# Patient Record
Sex: Female | Born: 1965 | Race: White | Hispanic: No | Marital: Married | Smoking: Never smoker
Health system: Southern US, Community
[De-identification: ages and names within clinical notes are randomized; demographics above are authoritative.]

## PROBLEM LIST (undated history)

## (undated) DIAGNOSIS — N289 Disorder of kidney and ureter, unspecified: Secondary | ICD-10-CM

## (undated) DIAGNOSIS — T7840XA Allergy, unspecified, initial encounter: Secondary | ICD-10-CM

## (undated) DIAGNOSIS — K219 Gastro-esophageal reflux disease without esophagitis: Secondary | ICD-10-CM

## (undated) DIAGNOSIS — Z8616 Personal history of COVID-19: Secondary | ICD-10-CM

## (undated) DIAGNOSIS — E079 Disorder of thyroid, unspecified: Secondary | ICD-10-CM

## (undated) DIAGNOSIS — D649 Anemia, unspecified: Secondary | ICD-10-CM

## (undated) DIAGNOSIS — K6289 Other specified diseases of anus and rectum: Secondary | ICD-10-CM

## (undated) DIAGNOSIS — I10 Essential (primary) hypertension: Secondary | ICD-10-CM

## (undated) DIAGNOSIS — M1990 Unspecified osteoarthritis, unspecified site: Secondary | ICD-10-CM

## (undated) HISTORY — PX: WISDOM TOOTH EXTRACTION: SHX21

## (undated) HISTORY — PX: BREAST CYST ASPIRATION: SHX578

## (undated) HISTORY — DX: Anemia, unspecified: D64.9

## (undated) HISTORY — DX: Essential (primary) hypertension: I10

## (undated) HISTORY — PX: BREAST BIOPSY: SHX20

## (undated) HISTORY — DX: Disorder of thyroid, unspecified: E07.9

## (undated) HISTORY — PX: OOPHORECTOMY: SHX86

## (undated) HISTORY — DX: Allergy, unspecified, initial encounter: T78.40XA

---

## 1998-03-02 ENCOUNTER — Other Ambulatory Visit: Admission: RE | Admit: 1998-03-02 | Discharge: 1998-03-02 | Payer: Self-pay | Admitting: Obstetrics & Gynecology

## 1998-09-18 ENCOUNTER — Other Ambulatory Visit: Admission: RE | Admit: 1998-09-18 | Discharge: 1998-09-18 | Payer: Self-pay | Admitting: Unknown Physician Specialty

## 1998-11-14 ENCOUNTER — Inpatient Hospital Stay (HOSPITAL_COMMUNITY): Admission: AD | Admit: 1998-11-14 | Discharge: 1998-11-14 | Payer: Self-pay | Admitting: Obstetrics and Gynecology

## 1998-11-17 ENCOUNTER — Inpatient Hospital Stay (HOSPITAL_COMMUNITY): Admission: RE | Admit: 1998-11-17 | Discharge: 1998-11-20 | Payer: Self-pay | Admitting: Obstetrics & Gynecology

## 1999-07-23 ENCOUNTER — Encounter (INDEPENDENT_AMBULATORY_CARE_PROVIDER_SITE_OTHER): Payer: Self-pay | Admitting: Specialist

## 1999-07-23 ENCOUNTER — Other Ambulatory Visit: Admission: RE | Admit: 1999-07-23 | Discharge: 1999-07-23 | Payer: Self-pay | Admitting: Obstetrics and Gynecology

## 1999-12-27 ENCOUNTER — Other Ambulatory Visit: Admission: RE | Admit: 1999-12-27 | Discharge: 1999-12-27 | Payer: Self-pay | Admitting: Obstetrics and Gynecology

## 2000-07-14 ENCOUNTER — Other Ambulatory Visit: Admission: RE | Admit: 2000-07-14 | Discharge: 2000-07-14 | Payer: Self-pay | Admitting: Obstetrics and Gynecology

## 2000-11-20 ENCOUNTER — Encounter: Payer: Self-pay | Admitting: Obstetrics and Gynecology

## 2000-11-20 ENCOUNTER — Ambulatory Visit (HOSPITAL_COMMUNITY): Admission: RE | Admit: 2000-11-20 | Discharge: 2000-11-20 | Payer: Self-pay | Admitting: Obstetrics and Gynecology

## 2001-07-24 ENCOUNTER — Other Ambulatory Visit: Admission: RE | Admit: 2001-07-24 | Discharge: 2001-07-24 | Payer: Self-pay | Admitting: Internal Medicine

## 2003-01-06 ENCOUNTER — Encounter: Payer: Self-pay | Admitting: Obstetrics and Gynecology

## 2003-01-06 ENCOUNTER — Ambulatory Visit (HOSPITAL_COMMUNITY): Admission: RE | Admit: 2003-01-06 | Discharge: 2003-01-06 | Payer: Self-pay | Admitting: Obstetrics and Gynecology

## 2003-10-04 ENCOUNTER — Encounter: Admission: RE | Admit: 2003-10-04 | Discharge: 2003-10-04 | Payer: Self-pay | Admitting: General Surgery

## 2006-12-24 ENCOUNTER — Encounter: Admission: RE | Admit: 2006-12-24 | Discharge: 2006-12-24 | Payer: Self-pay | Admitting: Obstetrics and Gynecology

## 2009-03-17 ENCOUNTER — Encounter: Admission: RE | Admit: 2009-03-17 | Discharge: 2009-03-17 | Payer: Self-pay | Admitting: Obstetrics and Gynecology

## 2009-11-26 ENCOUNTER — Emergency Department (HOSPITAL_COMMUNITY): Admission: EM | Admit: 2009-11-26 | Discharge: 2009-11-26 | Payer: Self-pay | Admitting: Emergency Medicine

## 2010-06-21 ENCOUNTER — Encounter: Admission: RE | Admit: 2010-06-21 | Discharge: 2010-06-21 | Payer: Self-pay | Admitting: Internal Medicine

## 2011-01-14 LAB — URINALYSIS, ROUTINE W REFLEX MICROSCOPIC
Bilirubin Urine: NEGATIVE
Hgb urine dipstick: NEGATIVE
Ketones, ur: NEGATIVE mg/dL
Protein, ur: NEGATIVE mg/dL
Specific Gravity, Urine: 1.005 (ref 1.005–1.030)
Urobilinogen, UA: 0.2 mg/dL (ref 0.0–1.0)
pH: 7.5 (ref 5.0–8.0)

## 2011-01-14 LAB — PREGNANCY, URINE: Preg Test, Ur: NEGATIVE

## 2011-01-14 LAB — D-DIMER, QUANTITATIVE: D-Dimer, Quant: <0.22 ug/mL-FEU (ref 0.00–0.48)

## 2011-01-14 LAB — CBC
MCHC: 35.2 g/dL (ref 30.0–36.0)
WBC: 8.5 10*3/uL (ref 4.0–10.5)

## 2011-01-14 LAB — BASIC METABOLIC PANEL
CO2: 26 mEq/L (ref 19–32)
GFR calc non Af Amer: >60 mL/min (ref >60)
Potassium: 3.6 mEq/L (ref 3.5–5.1)
Sodium: 140 mEq/L (ref 135–145)

## 2011-01-14 LAB — POCT CARDIAC MARKERS: CKMB, poc: <1 ng/mL — ABNORMAL LOW (ref 1.0–8.0)

## 2011-01-14 LAB — DIFFERENTIAL
Eosinophils Absolute: 0.3 10*3/uL (ref 0.0–0.7)
Monocytes Relative: 6 % (ref 3–12)

## 2011-06-03 ENCOUNTER — Other Ambulatory Visit: Payer: Self-pay | Admitting: Obstetrics and Gynecology

## 2011-06-03 DIAGNOSIS — Z1231 Encounter for screening mammogram for malignant neoplasm of breast: Secondary | ICD-10-CM

## 2011-06-10 ENCOUNTER — Other Ambulatory Visit: Payer: Self-pay | Admitting: Obstetrics and Gynecology

## 2011-06-10 DIAGNOSIS — N644 Mastodynia: Secondary | ICD-10-CM

## 2011-06-10 DIAGNOSIS — N6009 Solitary cyst of unspecified breast: Secondary | ICD-10-CM

## 2011-06-14 ENCOUNTER — Ambulatory Visit
Admission: RE | Admit: 2011-06-14 | Discharge: 2011-06-14 | Disposition: A | Discharge status: Home / Self Care | Payer: 59 | Source: Ambulatory Visit | Attending: Obstetrics and Gynecology | Admitting: Obstetrics and Gynecology

## 2011-06-14 ENCOUNTER — Other Ambulatory Visit: Payer: Self-pay | Admitting: Obstetrics and Gynecology

## 2011-06-14 DIAGNOSIS — N6009 Solitary cyst of unspecified breast: Secondary | ICD-10-CM

## 2011-06-14 DIAGNOSIS — N644 Mastodynia: Secondary | ICD-10-CM

## 2011-06-24 ENCOUNTER — Ambulatory Visit: Payer: Self-pay

## 2012-07-22 ENCOUNTER — Other Ambulatory Visit: Payer: Self-pay | Admitting: Obstetrics and Gynecology

## 2012-07-22 DIAGNOSIS — Z1231 Encounter for screening mammogram for malignant neoplasm of breast: Secondary | ICD-10-CM

## 2012-08-10 ENCOUNTER — Ambulatory Visit: Payer: 59

## 2012-08-11 ENCOUNTER — Ambulatory Visit
Admission: RE | Admit: 2012-08-11 | Discharge: 2012-08-11 | Disposition: A | Discharge status: Home / Self Care | Payer: Self-pay | Source: Ambulatory Visit | Attending: Obstetrics and Gynecology | Admitting: Obstetrics and Gynecology

## 2012-08-11 DIAGNOSIS — Z1231 Encounter for screening mammogram for malignant neoplasm of breast: Secondary | ICD-10-CM

## 2013-07-07 ENCOUNTER — Encounter (HOSPITAL_COMMUNITY): Payer: Self-pay | Admitting: Emergency Medicine

## 2013-07-07 ENCOUNTER — Emergency Department (HOSPITAL_COMMUNITY): Payer: BC Managed Care – PPO

## 2013-07-07 ENCOUNTER — Emergency Department (HOSPITAL_COMMUNITY)
Admission: EM | Admit: 2013-07-07 | Discharge: 2013-07-07 | Disposition: A | Discharge status: Home / Self Care | Payer: BC Managed Care – PPO | Attending: Emergency Medicine | Admitting: Emergency Medicine

## 2013-07-07 DIAGNOSIS — Z79899 Other long term (current) drug therapy: Secondary | ICD-10-CM | POA: Insufficient documentation

## 2013-07-07 DIAGNOSIS — S93409A Sprain of unspecified ligament of unspecified ankle, initial encounter: Secondary | ICD-10-CM | POA: Insufficient documentation

## 2013-07-07 DIAGNOSIS — Y9389 Activity, other specified: Secondary | ICD-10-CM | POA: Insufficient documentation

## 2013-07-07 DIAGNOSIS — X500XXA Overexertion from strenuous movement or load, initial encounter: Secondary | ICD-10-CM | POA: Insufficient documentation

## 2013-07-07 DIAGNOSIS — S93401A Sprain of unspecified ligament of right ankle, initial encounter: Secondary | ICD-10-CM

## 2013-07-07 DIAGNOSIS — Y929 Unspecified place or not applicable: Secondary | ICD-10-CM | POA: Insufficient documentation

## 2013-07-07 MED ORDER — NAPROXEN 500 MG PO TABS: 500.0000 mg | ORAL_TABLET | Freq: Two times a day (BID) | ORAL | Status: DC

## 2013-07-07 NOTE — ED Notes (Signed)
Pt states she heard something pop when her ankle rolled over.

## 2013-07-07 NOTE — ED Notes (Signed)
Pt. Stepped off porch and fell twisted  Rt. Ankle. Rt. Ankle swollen

## 2013-07-07 NOTE — ED Notes (Signed)
Ortho at bedside.

## 2013-07-07 NOTE — Progress Notes (Signed)
Orthopedic Tech Progress Note Patient Details:  Cassandra Warren December 07, 1965 [id number]  Ortho Devices Type of Ortho Device: ASO;Crutches Ortho Device/Splint Location: rle Ortho Device/Splint Interventions: Application   Nikki Dom 07/07/2013, 10:56 PM

## 2013-07-07 NOTE — ED Provider Notes (Signed)
This chart was scribed for Magnus Sinning PA-C, a non-physician practitioner working with Vida Roller, MD by Lewanda Rife, ED Scribe. This patient was seen in room TR09C/TR09C and the patient's care was started at 2234.    CSN: [id number]     Arrival date & time 07/07/13  2008 History   First MD Initiated Contact with Patient 07/07/13 2120     Chief Complaint  Patient presents with  . Ankle Pain   (Consider location/radiation/quality/duration/timing/severity/associated sxs/prior Treatment) The history is provided by the patient.   HPI Comments: Cassandra Warren is a 47 y.o. female who presents to the Emergency Department complaining of constant moderate right ankle pain onset PTA after stepping off her porch and twisting her ankle. Reports associated swelling. Reports pain is exacerbated by touch, movement and alleviated by nothing. Denies associated numbness. Reports trying ice with no relief of symptoms. Denies taking any pain medications to alleviate symptoms.   History reviewed. No pertinent past medical history. History reviewed. No pertinent past surgical history. No family history on file. History  Substance Use Topics  . Smoking status: Not on file  . Smokeless tobacco: Not on file  . Alcohol Use: Yes   OB History   Grav Para Term Preterm Abortions TAB SAB Ect Mult Living                 Review of Systems  Musculoskeletal:       Right ankle pain    A complete 10 system review of systems was obtained and all systems are negative except as noted in the HPI and PMH.    Allergies  Erythromycin and Morphine and related  Home Medications   Current Outpatient Rx  Name  Route  Sig  Dispense  Refill  . loratadine (CLARITIN) 10 MG tablet   Oral   Take 10 mg by mouth daily.         Marland Kitchen tetrahydrozoline-zinc (VISINE-AC) 0.05-0.25 % ophthalmic solution   Both Eyes   Place 2 drops into both eyes 3 (three) times daily as needed (for dry eyes).           BP  149/84  Pulse 85  Temp(Src) 98.5 F (36.9 C) (Oral)  Resp 18  Ht 5\' 4"  (1.626 m)  Wt 145 lb (65.772 kg)  BMI 24.88 kg/m2  SpO2 100%  LMP 06/10/2013 Physical Exam  Nursing note and vitals reviewed. Constitutional: She is oriented to person, place, and time. She appears well-developed and well-nourished. No distress.  HENT:  Head: Normocephalic and atraumatic.  Eyes: EOM are normal.  Neck: Neck supple. No tracheal deviation present.  Cardiovascular: Normal rate.   Pulses:      Dorsalis pedis pulses are 2+ on the right side.  Pulmonary/Chest: Effort normal. No respiratory distress.  Musculoskeletal:       Right ankle: She exhibits decreased range of motion (secondary to pain ). Tenderness. Lateral malleolus tenderness found.  Neurological: She is alert and oriented to person, place, and time. No sensory deficit.  Skin: Skin is warm and dry.  Psychiatric: She has a normal mood and affect. Her behavior is normal.    ED Course  Procedures (including critical care time) Medications - No data to display  Labs Review Labs Reviewed - No data to display Imaging Review Dg Ankle Complete Right  07/07/2013   *RADIOLOGY REPORT*  Clinical Data: Fall, twisting injury  RIGHT ANKLE - COMPLETE 3+ VIEW  Comparison: None.  Findings: Mild soft tissue swelling  laterally.  Normal alignment without fracture.  Malleoli, talus and calcaneus intact.  IMPRESSION: Soft tissue swelling.  No acute osseous finding   Original Report Authenticated By: Judie Petit. Shick, M.D.    MDM  No diagnosis found. Patient presenting with right ankle pain after twisting her ankle.  Xray negative.  Patient neurovascularly intact.  Patient given ankle ASO and crutches.  I personally performed the services described in this documentation, which was scribed in my presence. The recorded information has been reviewed and is accurate.    Pascal Lux [place], PA-C 07/08/13 [phone]

## 2013-07-07 NOTE — ED Notes (Signed)
PA at bedside.

## 2013-07-07 NOTE — ED Notes (Signed)
Pt states she was walking out the door and stepped off the porch wrong and rolled her rt foot. Visible swelling to rt ankle. Pt states she did not take any meds for ankle pain but did put ice on the foot. Pt rates pain 8/10.

## 2013-07-09 NOTE — ED Provider Notes (Signed)
Medical screening examination/treatment/procedure(s) were performed by non-physician practitioner and as supervising physician I was immediately available for consultation/collaboration.    Vida Roller, MD 07/09/13 [phone]

## 2014-01-31 ENCOUNTER — Other Ambulatory Visit: Payer: Self-pay

## 2014-01-31 DIAGNOSIS — Z1231 Encounter for screening mammogram for malignant neoplasm of breast: Secondary | ICD-10-CM

## 2014-02-07 ENCOUNTER — Ambulatory Visit
Admission: RE | Admit: 2014-02-07 | Discharge: 2014-02-07 | Disposition: A | Discharge status: Home / Self Care | Payer: BC Managed Care – PPO | Source: Ambulatory Visit

## 2014-02-07 DIAGNOSIS — Z1231 Encounter for screening mammogram for malignant neoplasm of breast: Secondary | ICD-10-CM

## 2017-01-22 LAB — BASIC METABOLIC PANEL
CREATININE: 1 (ref <=1.1)
Potassium: 4.9 (ref 3.4–5.3)
Sodium: 142 (ref 137–147)

## 2017-01-22 LAB — TSH: TSH: 1.56 (ref <=5.90)

## 2017-01-22 LAB — CBC AND DIFFERENTIAL
HEMATOCRIT: 35 — AB (ref 36–46)
HEMOGLOBIN: 11.2 — AB (ref 12.0–16.0)
Platelets: 254 (ref 150–399)
WBC: 5.8

## 2017-01-22 LAB — LIPID PANEL
HDL: 46 (ref 35–70)
LDL CALC: 124

## 2017-04-11 ENCOUNTER — Ambulatory Visit (INDEPENDENT_AMBULATORY_CARE_PROVIDER_SITE_OTHER): Payer: BLUE CROSS/BLUE SHIELD | Admitting: Family Medicine

## 2017-04-11 ENCOUNTER — Encounter: Payer: Self-pay | Admitting: Family Medicine

## 2017-04-11 ENCOUNTER — Ambulatory Visit: Payer: Self-pay | Admitting: Family Medicine

## 2017-04-11 VITALS — BP 142/100 | HR 74 | Temp 98.6°F | Ht 60.5 in | Wt 151.0 lb

## 2017-04-11 DIAGNOSIS — R790 Abnormal level of blood mineral: Secondary | ICD-10-CM

## 2017-04-11 DIAGNOSIS — E039 Hypothyroidism, unspecified: Secondary | ICD-10-CM

## 2017-04-11 DIAGNOSIS — R030 Elevated blood-pressure reading, without diagnosis of hypertension: Secondary | ICD-10-CM | POA: Diagnosis not present

## 2017-04-11 DIAGNOSIS — E663 Overweight: Secondary | ICD-10-CM | POA: Diagnosis not present

## 2017-04-11 DIAGNOSIS — Z7689 Persons encountering health services in other specified circumstances: Secondary | ICD-10-CM | POA: Diagnosis not present

## 2017-04-11 MED ORDER — SYNTHROID 50 MCG PO TABS: 50.0000 ug | ORAL_TABLET | Freq: Every day | ORAL | 3 refills | Status: DC

## 2017-04-11 NOTE — Patient Instructions (Addendum)
Fatlossfoodies.com HIIT- high intensity interval trainaing Try to add stretching   Mediterranean Diet A Mediterranean diet refers to food and lifestyle choices that are based on the traditions of countries located on the [employer]. This way of eating has been shown to help prevent certain conditions and improve outcomes for people who have chronic diseases, like kidney disease and heart disease. What are tips for following this plan? Lifestyle  Cook and eat meals together with your family, when possible.  Drink enough fluid to keep your urine clear or pale yellow.  Be physically active every day. This includes: ? Aerobic exercise like running or swimming. ? Leisure activities like gardening, walking, or housework.  Get 7-8 hours of sleep each night.  If recommended by your health care provider, drink red wine in moderation. This means 1 glass a day for nonpregnant women and 2 glasses a day for men. A glass of wine equals 5 oz (150 mL). Reading food labels  Check the serving size of packaged foods. For foods such as rice and pasta, the serving size refers to the amount of cooked product, not dry.  Check the total fat in packaged foods. Avoid foods that have saturated fat or trans fats.  Check the ingredients list for added sugars, such as corn syrup. Shopping  At the grocery store, buy most of your food from the areas near the walls of the store. This includes: ? Fresh fruits and vegetables (produce). ? Grains, beans, nuts, and seeds. Some of these may be available in unpackaged forms or large amounts (in bulk). ? Fresh seafood. ? Poultry and eggs. ? Low-fat dairy products.  Buy whole ingredients instead of prepackaged foods.  Buy fresh fruits and vegetables in-season from local farmers markets.  Buy frozen fruits and vegetables in resealable bags.  If you do not have access to quality fresh seafood, buy precooked frozen shrimp or canned fish, such as tuna, salmon,  or sardines.  Buy small amounts of raw or cooked vegetables, salads, or olives from the deli or salad bar at your store.  Stock your pantry so you always have certain foods on hand, such as olive oil, canned tuna, canned tomatoes, rice, pasta, and beans. Cooking  Cook foods with extra-virgin olive oil instead of using butter or other vegetable oils.  Have meat as a side dish, and have vegetables or grains as your main dish. This means having meat in small portions or adding small amounts of meat to foods like pasta or stew.  Use beans or vegetables instead of meat in common dishes like chili or lasagna.  Experiment with different cooking methods. Try roasting or broiling vegetables instead of steaming or sauteing them.  Add frozen vegetables to soups, stews, pasta, or rice.  Add nuts or seeds for added healthy fat at each meal. You can add these to yogurt, salads, or vegetable dishes.  Marinate fish or vegetables using olive oil, lemon juice, garlic, and fresh herbs. Meal planning  Plan to eat 1 vegetarian meal one day each week. Try to work up to 2 vegetarian meals, if possible.  Eat seafood 2 or more times a week.  Have healthy snacks readily available, such as: ? Vegetable sticks with hummus. ? [place] yogurt. ? Fruit and nut trail mix.  Eat balanced meals throughout the week. This includes: ? Fruit: 2-3 servings a day ? Vegetables: 4-5 servings a day ? Low-fat dairy: 2 servings a day ? Fish, poultry, or lean meat: 1 serving a day ?  Beans and legumes: 2 or more servings a week ? Nuts and seeds: 1-2 servings a day ? Whole grains: 6-8 servings a day ? Extra-virgin olive oil: 3-4 servings a day  Limit red meat and sweets to only a few servings a month What are my food choices?  Mediterranean diet ? Recommended ? Grains: Whole-grain pasta. Brown rice. Bulgar wheat. Polenta. Couscous. Whole-wheat bread. [place]. ? Vegetables: Artichokes. Beets. Broccoli. Cabbage.  Carrots. Eggplant. Green beans. Chard. Kale. Spinach. Onions. Leeks. Peas. Squash. Tomatoes. Peppers. Radishes. ? Fruits: Apples. Apricots. Avocado. Berries. Bananas. Cherries. Dates. Figs. Grapes. Lemons. Melon. Oranges. Peaches. Plums. Pomegranate. ? Meats and other protein foods: Beans. Almonds. Sunflower seeds. Pine nuts. Peanuts. [place]. Salmon. Scallops. Shrimp. [place]. Tilapia. Clams. Oysters. Eggs. ? Dairy: Low-fat milk. Cheese. Greek yogurt. ? Beverages: Water. Red wine. Herbal tea. ? Fats and oils: Extra virgin olive oil. Avocado oil. Grape seed oil. ? Sweets and desserts: [place] yogurt with honey. Baked apples. Poached pears. Trail mix. ? Seasoning and other foods: Basil. Cilantro. Coriander. Cumin. Mint. Parsley. Sage. Rosemary. Tarragon. Garlic. Oregano. Thyme. Pepper. Balsalmic vinegar. Tahini. Hummus. Tomato sauce. Olives. Mushrooms. ? Limit these ? Grains: Prepackaged pasta or rice dishes. Prepackaged cereal with added sugar. ? Vegetables: Deep fried potatoes (french fries). ? Fruits: Fruit canned in syrup. ? Meats and other protein foods: Beef. Pork. Lamb. Poultry with skin. Hot dogs. [place] Salines. ? Dairy: Ice cream. Sour cream. Whole milk. ? Beverages: Juice. Sugar-sweetened soft drinks. Beer. Liquor and spirits. ? Fats and oils: Butter. Canola oil. Vegetable oil. Beef fat (tallow). Lard. ? Sweets and desserts: Cookies. Cakes. Pies. Candy. ? Seasoning and other foods: Mayonnaise. Premade sauces and marinades. ? The items listed may not be a complete list. Talk with your dietitian about what dietary choices are right for you. Summary  The Mediterranean diet includes both food and lifestyle choices.  Eat a variety of fresh fruits and vegetables, beans, nuts, seeds, and whole grains.  Limit the amount of red meat and sweets that you eat.  Talk with your health care provider about whether it is safe for you to drink red wine in moderation. This means 1 glass a day for nonpregnant women  and 2 glasses a day for men. A glass of wine equals 5 oz (150 mL). This information is not intended to replace advice given to you by your health care provider. Make sure you discuss any questions you have with your health care provider. Document Released: 06/06/2016 Document Revised: 07/09/2016 Document Reviewed: 06/06/2016 Elsevier Interactive Patient Education  [employer].

## 2017-04-11 NOTE — Progress Notes (Signed)
Subjective:    Patient ID: Cassandra Warren, female    DOB: 1966/10/20, 51 y.o.   MRN: [id number]  HPI This is a 51 yo female who presents today to establish care. Previously saw Dr. Rollene Rotunda. 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 Job is stressful, she hopes to retire in a couple of years and do something else. Home life low stress. Married, has grown son, grandchild who is 33.   Last pap July- normal per patient, mammo within last year. She has labs from 3/18 with her today, normal CBC, tsh, ferritin low. Is taking Slow FE but has some constipation. Has labs checked through county nurse and has follow up recheck of ferritin planned.  Only concern today is weight. Has been trying to lose 10 pounds. Has increased exercise and has recently started Body Pump 2-3 times per week, also does cardo at gym. No stretching. Has cut back on her carbohydrate intake, eats a lot of protein and salads. No soda/juice, little dessert/sweets.   Hypothyroidism- has been on same dose Synthroid for 1-2 years now. Does not do as well on generic.   Elevated blood pressure- initial and repeat blood pressures elevated in office today. Patient reports "white coat hypertension." She reports outside readings normal. Occasionally has checked by [employer] nurse.   Past Medical History:  Diagnosis Date  . Thyroid disease    Past Surgical History:  Procedure Laterality Date  . CESAREAN SECTION     Family History  Problem Relation Age of Onset  . Diabetes Mother   . Heart disease Father   . Mental illness Brother    Social History  Substance Use Topics  . Smoking status: Never Smoker  . Smokeless tobacco: Never Used  . Alcohol use Yes      Review of Systems  Constitutional: Negative for fatigue and unexpected weight change.  Respiratory: Negative for shortness of breath.   Cardiovascular: Negative for chest pain, palpitations and leg swelling.  Gastrointestinal: Positive for constipation.    Genitourinary: Negative for menstrual problem.  Musculoskeletal: Negative.   Psychiatric/Behavioral: Negative for dysphoric mood. The patient is not nervous/anxious.        Objective:   Physical Exam Physical Exam  Vitals reviewed. Constitutional: Oriented to person, place, and time. Appears well-developed and well-nourished.  HENT:  Head: Normocephalic and atraumatic.  Eyes: Conjunctivae are normal.  Neck: Normal range of motion. Neck supple.  Cardiovascular: Normal rate.   Pulmonary/Chest: Effort normal.  Musculoskeletal: Normal range of motion.  Neurological: Alert and oriented to person, place, and time.  Skin: Skin is warm and dry.  Psychiatric: Normal mood and affect. Behavior is normal. Judgment and thought content normal.   BP (!) 142/100 (BP Location: Right Arm, Patient Position: Sitting, Cuff Size: Normal)   Pulse 74   Temp 98.6 F (37 C) (Oral)   Ht 5' 0.5" (1.537 m)   Wt 151 lb (68.5 kg)   SpO2 99%   BMI 29.00 kg/m  Wt Readings from Last 3 Encounters:  04/11/17 151 lb (68.5 kg)  07/07/13 145 lb (65.8 kg)   Repeat BP- 142/98     Depression screen PHQ 2/9 04/11/2017  Decreased Interest 0  Down, Depressed, Hopeless 0  PHQ - 2 Score 0    Assessment & Plan:  1. Encounter to establish care - will obtain records from previous provider - CPE in 2 months  2. Hypothyroidism, unspecified type - stable on current dose - SYNTHROID 50 MCG tablet; Take 1  tablet (50 mcg total) by mouth daily before breakfast.  Dispense: 90 tablet; Refill: 3  3. Elevated blood pressure reading - she will have checked at work and bring copy of readings to next visit  4. Low serum ferritin level - continue Slow FE, discussed ways to decreased constipation (increased fluid, increased fiber, stool softeners)  5. Overweight (BMI 25.0-29.9) - continue good exercise program with combination of weight lifting and cardio, discussed HIIT - provided information about Med Diet -  encouraged increased non starchy vegetables, water intake, regular meals and snacks   Clarene Reamer, FNP-BC  [facility]  04/12/2017 6:35 AM

## 2017-04-12 DIAGNOSIS — E039 Hypothyroidism, unspecified: Secondary | ICD-10-CM | POA: Insufficient documentation

## 2017-04-12 DIAGNOSIS — R790 Abnormal level of blood mineral: Secondary | ICD-10-CM | POA: Insufficient documentation

## 2017-04-12 DIAGNOSIS — E663 Overweight: Secondary | ICD-10-CM | POA: Insufficient documentation

## 2017-04-22 ENCOUNTER — Encounter: Payer: Self-pay | Admitting: Family Medicine

## 2017-05-26 ENCOUNTER — Ambulatory Visit (INDEPENDENT_AMBULATORY_CARE_PROVIDER_SITE_OTHER): Payer: Commercial Managed Care - PPO | Admitting: Family Medicine

## 2017-05-26 ENCOUNTER — Encounter: Payer: Self-pay | Admitting: Family Medicine

## 2017-05-26 VITALS — BP 140/100 | HR 83 | Ht 60.5 in | Wt 151.2 lb

## 2017-05-26 DIAGNOSIS — I10 Essential (primary) hypertension: Secondary | ICD-10-CM | POA: Diagnosis not present

## 2017-05-26 MED ORDER — HYDROCHLOROTHIAZIDE 12.5 MG PO CAPS
12.5000 mg | ORAL_CAPSULE | Freq: Every day | ORAL | 5 refills | Status: DC
Start: 2017-05-26 — End: 2017-09-02

## 2017-05-26 NOTE — Progress Notes (Signed)
Subjective:     Patient ID: Cassandra Warren, female   DOB: 1966/08/19, 51 y.o.   MRN: [id number]  HPI This is a 51 yo female who presents today for follow up of elevated blood pressure. She has been taking at drugstore- 118- 124/80s. No headache, no visual changes. Has occasional chest tightness, lasts a couple of minutes, 3x in last month. Notices with increased stress. Does not ever have chest tightness with exercise (vigorously exercises several times a week). Feels like stress at home improving.   Has some trouble sleeping during week. Is planning to take early retirement next year (12/19).   Review of Systems  Constitutional: Negative for activity change, appetite change, chills, diaphoresis, fatigue, fever and unexpected weight change.  HENT: Negative for congestion, dental problem, drooling, ear discharge, ear pain, facial swelling, hearing loss and mouth sores.   Eyes: Negative for photophobia, pain, discharge, redness, itching and visual disturbance.  Respiratory: Negative for apnea, cough, choking, chest tightness, shortness of breath, wheezing and stridor.   Cardiovascular: Negative for chest pain, palpitations and leg swelling.  Gastrointestinal: Negative for abdominal distention, abdominal pain, anal bleeding, blood in stool and constipation.  Endocrine: Negative for cold intolerance, heat intolerance, polydipsia, polyphagia and polyuria.  Genitourinary: Negative.   Musculoskeletal: Negative.   Skin: Negative.   Neurological: Negative.   Hematological: Negative.   Psychiatric/Behavioral: Negative for agitation, behavioral problems, confusion, decreased concentration, dysphoric mood, hallucinations, self-injury, sleep disturbance and suicidal ideas. The patient is nervous/anxious. The patient is not hyperactive.    Past Medical History:  Diagnosis Date  . Thyroid disease    Past Surgical History:  Procedure Laterality Date  . CESAREAN SECTION     Family History  Problem  Relation Age of Onset  . Diabetes Mother   . Heart disease Father   . Mental illness Brother    Social History  Substance Use Topics  . Smoking status: Never Smoker  . Smokeless tobacco: Never Used  . Alcohol use Yes       Objective:   Physical Exam Physical Exam  Constitutional: Oriented to person, place, and time. She appears well-developed and well-nourished.  HENT:  Head: Normocephalic and atraumatic.  Eyes: Conjunctivae are normal.  Neck: Normal range of motion. Neck supple.  Cardiovascular: Normal rate, regular rhythm and normal heart sounds.   Pulmonary/Chest: Effort normal and breath sounds normal.  Musculoskeletal: Normal range of motion. No edema.  Neurological: Alert and oriented to person, place, and time.  Skin: Skin is warm and dry.  Psychiatric: Normal mood and affect. Behavior is normal. Judgment and thought content normal.  Vitals reviewed.     BP (!) 140/100   Pulse 83   Ht 5' 0.5" (1.537 m)   Wt 151 lb 3.2 oz (68.6 kg)   SpO2 99%   BMI 29.04 kg/m  Wt Readings from Last 3 Encounters:  05/26/17 151 lb 3.2 oz (68.6 kg)  04/11/17 151 lb (68.5 kg)  07/07/13 145 lb (65.8 kg)   BP Readings from Last 3 Encounters:  05/26/17 (!) 140/100  04/11/17 (!) 142/100  07/07/13 149/84    Assessment:     1. Essential hypertension - Provided written and verbal information regarding diagnosis and treatment. - discussed starting low dose antihypertensive due to repeated high readings and family history  - hydrochlorothiazide (MICROZIDE) 12.5 MG capsule; Take 1 capsule (12.5 mg total) by mouth daily.  Dispense: 30 capsule; Refill: 5 - follow up in 1 month for BP check and BMP -  provided information about Mediterranean diet and encouraged continued exercise and stress relief measures. Clarene Reamer, FNP-BC  [facility] [place]  05/30/2017 1:29 PM     Plan:     See assessment

## 2017-05-26 NOTE — Patient Instructions (Signed)
Mediterranean Diet A Mediterranean diet refers to food and lifestyle choices that are based on the traditions of countries located on the [place]. This way of eating has been shown to help prevent certain conditions and improve outcomes for people who have chronic diseases, like kidney disease and heart disease. What are tips for following this plan? Lifestyle  Cook and eat meals together with your family, when possible.  Drink enough fluid to keep your urine clear or pale yellow.  Be physically active every day. This includes: ? Aerobic exercise like running or swimming. ? Leisure activities like gardening, walking, or housework.  Get 7-8 hours of sleep each night.  If recommended by your health care provider, drink red wine in moderation. This means 1 glass a day for nonpregnant women and 2 glasses a day for men. A glass of wine equals 5 oz (150 mL). Reading food labels  Check the serving size of packaged foods. For foods such as rice and pasta, the serving size refers to the amount of cooked product, not dry.  Check the total fat in packaged foods. Avoid foods that have saturated fat or trans fats.  Check the ingredients list for added sugars, such as corn syrup. Shopping  At the grocery store, buy most of your food from the areas near the walls of the store. This includes: ? Fresh fruits and vegetables (produce). ? Grains, beans, nuts, and seeds. Some of these may be available in unpackaged forms or large amounts (in bulk). ? Fresh seafood. ? Poultry and eggs. ? Low-fat dairy products.  Buy whole ingredients instead of prepackaged foods.  Buy fresh fruits and vegetables in-season from local farmers markets.  Buy frozen fruits and vegetables in resealable bags.  If you do not have access to quality fresh seafood, buy precooked frozen shrimp or canned fish, such as tuna, salmon, or sardines.  Buy small amounts of raw or cooked vegetables, salads, or olives from the  deli or salad bar at your store.  Stock your pantry so you always have certain foods on hand, such as olive oil, canned tuna, canned tomatoes, rice, pasta, and beans. Cooking  Cook foods with extra-virgin olive oil instead of using butter or other vegetable oils.  Have meat as a side dish, and have vegetables or grains as your main dish. This means having meat in small portions or adding small amounts of meat to foods like pasta or stew.  Use beans or vegetables instead of meat in common dishes like chili or lasagna.  Experiment with different cooking methods. Try roasting or broiling vegetables instead of steaming or sauteing them.  Add frozen vegetables to soups, stews, pasta, or rice.  Add nuts or seeds for added healthy fat at each meal. You can add these to yogurt, salads, or vegetable dishes.  Marinate fish or vegetables using olive oil, lemon juice, garlic, and fresh herbs. Meal planning  Plan to eat 1 vegetarian meal one day each week. Try to work up to 2 vegetarian meals, if possible.  Eat seafood 2 or more times a week.  Have healthy snacks readily available, such as: ? Vegetable sticks with hummus. ? Greek yogurt. ? Fruit and nut trail mix.  Eat balanced meals throughout the week. This includes: ? Fruit: 2-3 servings a day ? Vegetables: 4-5 servings a day ? Low-fat dairy: 2 servings a day ? Fish, poultry, or lean meat: 1 serving a day ? Beans and legumes: 2 or more servings a week ? Nuts   and seeds: 1-2 servings a day ? Whole grains: 6-8 servings a day ? Extra-virgin olive oil: 3-4 servings a day  Limit red meat and sweets to only a few servings a month What are my food choices?  Mediterranean diet ? Recommended ? Grains: Whole-grain pasta. Brown rice. Bulgar wheat. Polenta. Couscous. Whole-wheat bread. Oatmeal. Quinoa. ? Vegetables: Artichokes. Beets. Broccoli. Cabbage. Carrots. Eggplant. Green beans. Chard. Kale. Spinach. Onions. Leeks. Peas. Squash.  Tomatoes. Peppers. Radishes. ? Fruits: Apples. Apricots. Avocado. Berries. Bananas. Cherries. Dates. Figs. Grapes. Lemons. Melon. Oranges. Peaches. Plums. Pomegranate. ? Meats and other protein foods: Beans. Almonds. Sunflower seeds. Pine nuts. Peanuts. Cod. Salmon. Scallops. Shrimp. Tuna. Tilapia. Clams. Oysters. Eggs. ? Dairy: Low-fat milk. Cheese. Greek yogurt. ? Beverages: Water. Red wine. Herbal tea. ? Fats and oils: Extra virgin olive oil. Avocado oil. Grape seed oil. ? Sweets and desserts: Greek yogurt with honey. Baked apples. Poached pears. Trail mix. ? Seasoning and other foods: Basil. Cilantro. Coriander. Cumin. Mint. Parsley. Sage. Rosemary. Tarragon. Garlic. Oregano. Thyme. Pepper. Balsalmic vinegar. Tahini. Hummus. Tomato sauce. Olives. Mushrooms. ? Limit these ? Grains: Prepackaged pasta or rice dishes. Prepackaged cereal with added sugar. ? Vegetables: Deep fried potatoes (french fries). ? Fruits: Fruit canned in syrup. ? Meats and other protein foods: Beef. Pork. Lamb. Poultry with skin. Hot dogs. Bacon. ? Dairy: Ice cream. Sour cream. Whole milk. ? Beverages: Juice. Sugar-sweetened soft drinks. Beer. Liquor and spirits. ? Fats and oils: Butter. Canola oil. Vegetable oil. Beef fat (tallow). Lard. ? Sweets and desserts: Cookies. Cakes. Pies. Candy. ? Seasoning and other foods: Mayonnaise. Premade sauces and marinades. ? The items listed may not be a complete list. Talk with your dietitian about what dietary choices are right for you. Summary  The Mediterranean diet includes both food and lifestyle choices.  Eat a variety of fresh fruits and vegetables, beans, nuts, seeds, and whole grains.  Limit the amount of red meat and sweets that you eat.  Talk with your health care provider about whether it is safe for you to drink red wine in moderation. This means 1 glass a day for nonpregnant women and 2 glasses a day for men. A glass of wine equals 5 oz (150 mL). This information  is not intended to replace advice given to you by your health care provider. Make sure you discuss any questions you have with your health care provider. Document Released: 06/06/2016 Document Revised: 07/09/2016 Document Reviewed: 06/06/2016 Elsevier Interactive Patient Education  2018 Elsevier Inc.  

## 2017-05-28 ENCOUNTER — Other Ambulatory Visit: Payer: Self-pay | Admitting: Obstetrics and Gynecology

## 2017-05-28 DIAGNOSIS — Z1231 Encounter for screening mammogram for malignant neoplasm of breast: Secondary | ICD-10-CM

## 2017-05-30 ENCOUNTER — Encounter: Payer: Self-pay | Admitting: Family Medicine

## 2017-05-30 ENCOUNTER — Ambulatory Visit: Payer: BLUE CROSS/BLUE SHIELD | Admitting: Family Medicine

## 2017-05-30 DIAGNOSIS — I10 Essential (primary) hypertension: Secondary | ICD-10-CM | POA: Insufficient documentation

## 2017-06-03 ENCOUNTER — Ambulatory Visit
Admission: RE | Admit: 2017-06-03 | Discharge: 2017-06-03 | Disposition: A | Discharge status: Home / Self Care | Payer: Commercial Managed Care - PPO | Source: Ambulatory Visit | Attending: Obstetrics and Gynecology | Admitting: Obstetrics and Gynecology

## 2017-06-03 DIAGNOSIS — Z1231 Encounter for screening mammogram for malignant neoplasm of breast: Secondary | ICD-10-CM

## 2017-06-04 ENCOUNTER — Ambulatory Visit: Payer: BLUE CROSS/BLUE SHIELD | Admitting: Family Medicine

## 2017-06-05 ENCOUNTER — Other Ambulatory Visit: Payer: Self-pay | Admitting: Obstetrics and Gynecology

## 2017-06-05 DIAGNOSIS — R928 Other abnormal and inconclusive findings on diagnostic imaging of breast: Secondary | ICD-10-CM

## 2017-06-06 ENCOUNTER — Ambulatory Visit: Payer: BLUE CROSS/BLUE SHIELD

## 2017-06-10 ENCOUNTER — Ambulatory Visit
Admission: RE | Admit: 2017-06-10 | Discharge: 2017-06-10 | Disposition: A | Discharge status: Home / Self Care | Payer: Commercial Managed Care - PPO | Source: Ambulatory Visit | Attending: Obstetrics and Gynecology | Admitting: Obstetrics and Gynecology

## 2017-06-10 DIAGNOSIS — R928 Other abnormal and inconclusive findings on diagnostic imaging of breast: Secondary | ICD-10-CM

## 2017-06-13 ENCOUNTER — Other Ambulatory Visit: Payer: Commercial Managed Care - PPO

## 2017-06-25 ENCOUNTER — Telehealth: Payer: Self-pay | Admitting: Family Medicine

## 2017-06-25 ENCOUNTER — Ambulatory Visit: Payer: BLUE CROSS/BLUE SHIELD | Admitting: Family Medicine

## 2017-06-25 LAB — BASIC METABOLIC PANEL
BUN: 15 (ref 4–21)
Creatinine: 1.1 (ref 0.5–1.1)
Glucose: 96
Potassium: 3.7 (ref 3.4–5.3)
Sodium: 141 (ref 137–147)

## 2017-06-25 NOTE — Telephone Encounter (Signed)
Patient called to advise that the [place] nurse [place] for her kidneys and their office should be faxing the results this week. Office on stand by for results.

## 2017-06-25 NOTE — Telephone Encounter (Signed)
Noted will look out for the fax.

## 2017-06-27 ENCOUNTER — Encounter: Payer: Self-pay | Admitting: Family Medicine

## 2017-06-27 NOTE — Telephone Encounter (Addendum)
Patient calling to make sure lab results were received.  Advised they were, and gave to [facility] to review.   Patient' wants Debbie's opinion on Creatinine level.   Ty,  -LL

## 2017-06-27 NOTE — Telephone Encounter (Signed)
Called and left patient detailed voice mail regarding mildly elevated creatinine (1.05), can recheck in 6 months, hydrate well and avoid excessive use of NSAIDs. I instructed her to call the office if she has further questions and I will send labs to be abstracted and scanned to patient's chart.

## 2017-07-01 ENCOUNTER — Encounter: Payer: Self-pay | Admitting: Family Medicine

## 2017-09-02 ENCOUNTER — Ambulatory Visit (INDEPENDENT_AMBULATORY_CARE_PROVIDER_SITE_OTHER): Payer: Commercial Managed Care - PPO | Admitting: Physician Assistant

## 2017-09-02 ENCOUNTER — Encounter: Payer: Self-pay | Admitting: Physician Assistant

## 2017-09-02 VITALS — BP 140/90 | HR 94 | Temp 98.1°F | Ht 63.0 in | Wt 153.5 lb

## 2017-09-02 DIAGNOSIS — Z7689 Persons encountering health services in other specified circumstances: Secondary | ICD-10-CM | POA: Diagnosis not present

## 2017-09-02 DIAGNOSIS — E039 Hypothyroidism, unspecified: Secondary | ICD-10-CM

## 2017-09-02 DIAGNOSIS — I10 Essential (primary) hypertension: Secondary | ICD-10-CM | POA: Diagnosis not present

## 2017-09-02 MED ORDER — LISINOPRIL 10 MG PO TABS: 10.0000 mg | ORAL_TABLET | Freq: Every day | ORAL | 1 refills | Status: DC

## 2017-09-02 NOTE — Patient Instructions (Signed)
It was great to meet you!  Stop hydrochlorothiazide today.  Start 10 mg Lisinopril tomorrow.   Follow-up with [place] in 2-4 weeks for blood pressure, sooner if needed.

## 2017-09-02 NOTE — Progress Notes (Signed)
Cassandra Warren is a 51 y.o. female here to Establish Care.  I acted as a Education administrator for [employer], PA-C Anselmo Pickler, LPN  History of Present Illness:   Chief Complaint  Patient presents with  . Establish Care    transfer from [place]  . Hypertension    Acute Concerns: HTN -- Currently taking 25 HCTZ mg. At home blood pressure readings are: 132/80. Patient denies chest pain, SOB, blurred vision, dizziness, unusual headaches, lower leg swelling. Patient is compliant with medication. Denies excessive caffeine intake, stimulant usage, excessive alcohol intake, or increase in salt consumption. She does endorse significant stress at work. Just had an eye appointment where they adjusted her prescription. She was told by her doctor that she doesn't have any evidence of macular degeneration. She has concerns about being on HCTZ because her mom and grandmother were both on this medication and had issues with macular degeneration. Hypothyroidism -- Dr. Ronita Hipps has been managing this in the past. Does have to take the Synthroid name brand. Currently on 50 mg. Most recent labs reveal TSH 1.56 and free T4 1.34. She denies any unusual thyroid symptoms including palpitations, issues with fatigue, or changes in temperature tolerance.  Health Maintenance: Weight -- Weight: 153 lb 8 oz (69.6 kg)   Depression screen [facility] 2/9 04/11/2017  Decreased Interest 0  Down, Depressed, Hopeless 0  PHQ - 2 Score 0    No flowsheet data found.  Other providers/specialists: Dr. Ronita Hipps -- Ob-Gyn    Past Medical History:  Diagnosis Date  . Hypertension   . Thyroid disease      Social History   Socioeconomic History  . Marital status: Married    Spouse name: Not on file  . Number of children: Not on file  . Years of education: Not on file  . Highest education level: Not on file  Social Needs  . Financial resource strain: Not on file  . Food insecurity - worry: Not on file  . Food insecurity -  inability: Not on file  . Transportation needs - medical: Not on file  . Transportation needs - non-medical: Not on file  Occupational History  . Not on file  Tobacco Use  . Smoking status: Never Smoker  . Smokeless tobacco: Never Used  Substance and Sexual Activity  . Alcohol use: Yes  . Drug use: No  . Sexual activity: Yes    Birth control/protection: None  Other Topics Concern  . Not on file  Social History Narrative  . Not on file    Past Surgical History:  Procedure Laterality Date  . BREAST CYST ASPIRATION Left   . CESAREAN SECTION      Family History  Problem Relation Age of Onset  . Diabetes Mother   . Heart disease Father   . Mental illness Brother   . Breast cancer Maternal Aunt     Allergies  Allergen Reactions  . Erythromycin Other (See Comments)    Hands and feet tingling  . Morphine And Related Rash     Current Medications:   Current Outpatient Medications:  .  loratadine (CLARITIN) 10 MG tablet, Take 10 mg by mouth daily., Disp: , Rfl:  .  SYNTHROID 50 MCG tablet, Take 1 tablet (50 mcg total) by mouth daily before breakfast., Disp: 90 tablet, Rfl: 3 .  lisinopril (PRINIVIL,ZESTRIL) 10 MG tablet, Take 1 tablet (10 mg total) daily by mouth., Disp: 30 tablet, Rfl: 1   Review of Systems:   ROS  Negative  unless specified per HPI  Vitals:   Vitals:   09/02/17 1552 09/02/17 1627  BP: (!) 142/90 140/90  Pulse: 94   Temp: 98.1 F (36.7 C)   TempSrc: Oral   SpO2: 98%   Weight: 153 lb 8 oz (69.6 kg)   Height: 5\' 3"  (1.6 m)      Body mass index is 27.19 kg/m.  Physical Exam:   Physical Exam  Constitutional: She appears well-developed. She is cooperative.  Non-toxic appearance. She does not have a sickly appearance. She does not appear ill. No distress.  Cardiovascular: Normal rate, regular rhythm, S1 normal, S2 normal, normal heart sounds and normal pulses.  No LE edema  Pulmonary/Chest: Effort normal and breath sounds normal.   Neurological: She is alert. GCS eye subscore is 4. GCS verbal subscore is 5. GCS motor subscore is 6.  Skin: Skin is warm, dry and intact.  Psychiatric: She has a normal mood and affect. Her speech is normal and behavior is normal.  Nursing note and vitals reviewed.   Assessment and Plan:    Daneshia was seen today for establish care and hypertension.  Diagnoses and all orders for this visit:  Encounter to establish care  Essential hypertension She would like to switch off of HCTZ. I will stop this today. Check BMP and start Lisinopril 10 mg. Follow-up in 2-4 weeks to assess medication efficacy, sooner if needed.  Hypothyroidism, unspecified type Re-check labs today (I have given her a lab slip to have this completed at work.) Will refill medication after lab results return.   Other orders -     lisinopril (PRINIVIL,ZESTRIL) 10 MG tablet; Take 1 tablet (10 mg total) daily by mouth.    . Reviewed expectations re: course of current medical issues. . Discussed self-management of symptoms. . Outlined signs and symptoms indicating need for more acute intervention. . Patient verbalized understanding and all questions were answered. . See orders for this visit as documented in the electronic medical record. . Patient received an After-Visit Summary.   CMA or LPN served as scribe during this visit. History, Physical, and Plan performed by medical provider. Documentation and orders reviewed and attested to.   [name], PA-C

## 2017-09-10 ENCOUNTER — Other Ambulatory Visit: Payer: Self-pay | Admitting: Family Medicine

## 2017-09-10 DIAGNOSIS — E039 Hypothyroidism, unspecified: Secondary | ICD-10-CM

## 2017-09-12 LAB — TSH: TSH: 1.84 (ref 0.41–5.90)

## 2017-09-12 LAB — BASIC METABOLIC PANEL
BUN: 14 (ref 4–21)
Creatinine: 1 (ref 0.5–1.1)
Glucose: 79
Potassium: 4.4 (ref 3.4–5.3)
Sodium: 140 (ref 137–147)

## 2017-09-12 NOTE — Telephone Encounter (Signed)
Left detailed message on personal voicemail received refill request from [facility] for Synthroid you should have refills available. I called the pharmacy and you do have refills, insurance changed will not cover 90 day only 30 day but have refills left they will fill Rx. Any questions please call office.

## 2017-09-12 NOTE — Telephone Encounter (Signed)
Called and left voicemail for pt to call pharmacy 3 refills on this medication.

## 2017-09-22 ENCOUNTER — Encounter: Payer: Self-pay | Admitting: Physician Assistant

## 2017-09-22 ENCOUNTER — Ambulatory Visit: Payer: Commercial Managed Care - PPO | Admitting: Physician Assistant

## 2017-09-22 LAB — ESTIMATED GFR: EGFR (Non-African Amer.): 64

## 2017-09-22 LAB — CALCIUM: CALCIUM: 9.2

## 2017-09-22 LAB — T4, FREE: FREE T4: 1.27

## 2017-09-29 ENCOUNTER — Ambulatory Visit (INDEPENDENT_AMBULATORY_CARE_PROVIDER_SITE_OTHER): Payer: Commercial Managed Care - PPO | Admitting: Family Medicine

## 2017-09-29 ENCOUNTER — Encounter: Payer: Self-pay | Admitting: Family Medicine

## 2017-09-29 DIAGNOSIS — N289 Disorder of kidney and ureter, unspecified: Secondary | ICD-10-CM | POA: Diagnosis not present

## 2017-09-29 DIAGNOSIS — I10 Essential (primary) hypertension: Secondary | ICD-10-CM | POA: Diagnosis not present

## 2017-09-29 NOTE — Progress Notes (Signed)
    Subjective:  Cassandra Warren is a 51 y.o. female who presents today with a chief complaint of HTN follow up.   HPI:  Hypertension, established problem, Stable BP Readings from Last 3 Encounters:  09/29/17 134/78  09/02/17 140/90  05/26/17 (!) 140/100   Seen by PCP last month. Started on lisinopril 10mg  daily. Tolerating this dose without side effects.   ROS: Denies any chest pain, shortness of breath, dyspnea on exertion, leg edema.   Kidney Lesion, New Issue Several year history. Last saw urology 2 or 3 years ago.  Was told that it was benign.  She was advised to have occasional ultrasound to monitor.  No hematuria.  No dysuria.  No abdominal pain.  No nausea or vomiting.  ROS: Per HPI  Objective:  Physical Exam: BP 134/78   Pulse 79   Ht 5\' 3"  (1.6 m)   Wt 154 lb 6.4 oz (70 kg)   SpO2 100%   BMI 27.35 kg/m   Gen: NAD, resting comfortably CV: RRR with no murmurs appreciated Pulm: NWOB, CTAB with no crackles, wheezes, or rhonchi  Assessment/Plan:  Essential hypertension At goal.  Creatinine stable.  Continue lisinopril 10 mg daily.  Kidney lesion We will obtain records from [facility].   Algis Greenhouse. Jerline Pain, MD 09/29/2017 5:08 PM

## 2017-09-29 NOTE — Assessment & Plan Note (Addendum)
At goal.  Creatinine stable.  Discussed lifestyle interventions including weight loss, healthy diet, regular exercise, and low salt intake.  Continue lisinopril 10 mg daily.

## 2017-09-29 NOTE — Assessment & Plan Note (Signed)
We will obtain records from [facility].

## 2017-11-03 ENCOUNTER — Other Ambulatory Visit: Payer: Self-pay | Admitting: Family Medicine

## 2017-12-02 ENCOUNTER — Other Ambulatory Visit: Payer: Self-pay | Admitting: Family Medicine

## 2017-12-08 ENCOUNTER — Encounter: Payer: Self-pay | Admitting: Physician Assistant

## 2017-12-08 ENCOUNTER — Ambulatory Visit (INDEPENDENT_AMBULATORY_CARE_PROVIDER_SITE_OTHER): Payer: Commercial Managed Care - PPO | Admitting: Physician Assistant

## 2017-12-08 VITALS — BP 118/82 | HR 86 | Temp 97.8°F | Resp 16 | Ht 63.0 in | Wt 153.6 lb

## 2017-12-08 DIAGNOSIS — R0789 Other chest pain: Secondary | ICD-10-CM | POA: Diagnosis not present

## 2017-12-08 DIAGNOSIS — E039 Hypothyroidism, unspecified: Secondary | ICD-10-CM

## 2017-12-08 DIAGNOSIS — R202 Paresthesia of skin: Secondary | ICD-10-CM | POA: Diagnosis not present

## 2017-12-08 LAB — COMPREHENSIVE METABOLIC PANEL
ALT: 15 U/L (ref 0–35)
AST: 16 U/L (ref 0–37)
Albumin: 3.9 g/dL (ref 3.5–5.2)
Alkaline Phosphatase: 61 U/L (ref 39–117)
BUN: 15 mg/dL (ref 6–23)
CHLORIDE: 105 meq/L (ref 96–112)
CO2: 26 meq/L (ref 19–32)
Calcium: 8.9 mg/dL (ref 8.4–10.5)
Creatinine, Ser: 0.92 mg/dL (ref 0.40–1.20)
GFR: 68.83 mL/min (ref >60.00)
GLUCOSE: 87 mg/dL (ref 70–99)
POTASSIUM: 4.3 meq/L (ref 3.5–5.1)
SODIUM: 138 meq/L (ref 135–145)
Total Bilirubin: 0.7 mg/dL (ref 0.2–1.2)
Total Protein: 6.9 g/dL (ref 6.0–8.3)

## 2017-12-08 LAB — TSH: TSH: 1.95 u[IU]/mL (ref 0.35–4.50)

## 2017-12-08 LAB — T4, FREE: FREE T4: 1 ng/dL (ref 0.60–1.60)

## 2017-12-08 NOTE — Patient Instructions (Signed)
It was great to see you.  Please buy a blood pressure cuff to keep better track of your blood pressure. It's good to monitor it a few times a week, or more frequently if you have concerns. Keep an eye on blood pressure - if consistently >140/90, please let me know.  We will call you with your lab results.  If you develop any new symptoms or symptoms that persist, please go to the emergency department.  If you would like to go to cardiology for a second opinion and to discuss your risk factors, I would be happy to do this.

## 2017-12-08 NOTE — Progress Notes (Signed)
Cassandra Warren is a 52 y.o. female here for a new problem.  SCRIBE STATEMENT  History of Present Illness:   Chief Complaint  Patient presents with  . Tingling    HPI   Yesterday patient developed tingling in bilateral hands and feet, after a few minutes she also had some chest tightness and "heavy breathing." She was lying in bed. She put a cool cloth on her head and felt relief. Patient reports that 3 years ago she had the same issue without tingling and was worked up for anxiety and started on low-dose anxiety medication (klonopin). She also -- she used this prn for a few months but eventually stopped it. She recently endorses eating shellfish over the weekend but denies swelling of lips, airway, rash or any other allergy-like symptoms. She continues to have monthly periods. Thursday of last week started her period.   Did have a little bit of worry over the weekend about her grandson. No unusual HA, changes in vision, no weakness. No history of CVA. Dad had MI 6 years ago, around age 98 y/o. She reports that she worries a lot about her heart. Did have wine prior to this event, had 3 glasses that evening at a winery, and does state that one of the other episodes that she had that was similar to this was after she had wine in the past. Does get GERD-like symptoms which is well controlled with prn Nexium. Denies exertional symptoms, recent travel, recent URI, weakness, slurred speech, unusual HA's, dizziness.  Takes thyroid medication as prescribed.  Lab Results  Component Value Date   TSH 1.84 09/12/2017   GAD 7 : Generalized Anxiety Score 12/08/2017  Nervous, Anxious, on Edge 1  Control/stop worrying 1  Worry too much - different things 1  Trouble relaxing 1  Restless 1  Easily annoyed or irritable 1  Afraid - awful might happen 1  Total GAD 7 Score 7       Past Medical History:  Diagnosis Date  . Hypertension   . Thyroid disease      Social History   Socioeconomic History   . Marital status: Married    Spouse name: Not on file  . Number of children: Not on file  . Years of education: Not on file  . Highest education level: Not on file  Social Needs  . Financial resource strain: Not on file  . Food insecurity - worry: Not on file  . Food insecurity - inability: Not on file  . Transportation needs - medical: Not on file  . Transportation needs - non-medical: Not on file  Occupational History  . Not on file  Tobacco Use  . Smoking status: Never Smoker  . Smokeless tobacco: Never Used  Substance and Sexual Activity  . Alcohol use: Yes  . Drug use: No  . Sexual activity: Yes    Birth control/protection: None  Other Topics Concern  . Not on file  Social History Narrative  . Not on file    Past Surgical History:  Procedure Laterality Date  . BREAST CYST ASPIRATION Left   . CESAREAN SECTION      Family History  Problem Relation Age of Onset  . Diabetes Mother   . Heart disease Father   . Mental illness Brother   . Breast cancer Maternal Aunt     Allergies  Allergen Reactions  . Erythromycin Other (See Comments)    Hands and feet tingling  . Morphine And Related Rash  Current Medications:   Current Outpatient Medications:  .  lisinopril (PRINIVIL,ZESTRIL) 10 MG tablet, Take 1 tablet (10 mg total) daily by mouth., Disp: 30 tablet, Rfl: 1 .  SYNTHROID 50 MCG tablet, Take 1 tablet (50 mcg total) by mouth daily before breakfast., Disp: 90 tablet, Rfl: 3   Review of Systems:   Review of Systems  Constitutional: Negative for chills, fever, malaise/fatigue and weight loss.  HENT: Negative for hearing loss.   Eyes: Negative for blurred vision, double vision and photophobia.  Respiratory: Negative for shortness of breath.   Cardiovascular: Negative for chest pain, orthopnea, claudication and leg swelling.  Gastrointestinal: Positive for heartburn. Negative for nausea and vomiting.  Musculoskeletal: Negative for myalgias and neck pain.   Neurological: Positive for tingling. Negative for dizziness and headaches.  Psychiatric/Behavioral: The patient is nervous/anxious.     Vitals:   Vitals:   12/08/17 1308  BP: 118/82  Pulse: 86  Resp: 16  Temp: 97.8 F (36.6 C)  TempSrc: Oral  SpO2: 98%  Weight: 153 lb 9.6 oz (69.7 kg)  Height: 5\' 3"  (1.6 m)     Body mass index is 27.21 kg/m.  Physical Exam:   Physical Exam  Constitutional: She appears well-developed. She is cooperative.  Non-toxic appearance. She does not have a sickly appearance. She does not appear ill. No distress.  Cardiovascular: Normal rate, regular rhythm, S1 normal, S2 normal, normal heart sounds and normal pulses.  No LE edema  Pulmonary/Chest: Effort normal and breath sounds normal.  Neurological: She is alert. She has normal strength. No cranial nerve deficit or sensory deficit. Coordination and gait normal. GCS eye subscore is 4. GCS verbal subscore is 5. GCS motor subscore is 6.  Reflex Scores:      Patellar reflexes are 2+ on the right side and 2+ on the left side. Skin: Skin is warm, dry and intact.  Psychiatric: She has a normal mood and affect. Her speech is normal and behavior is normal.  Nursing note and vitals reviewed.  EKG tracing is personally reviewed.  EKG notes NSR.  No acute changes.    Assessment and Plan:    Cassandra Warren was seen today for tingling.  Diagnoses and all orders for this visit:  Paresthesias Neuro exam benign. No red flags on exam. Will check electrolytes. Encouraged hydration. Reviewed stroke precautions. If symptoms return or worsen, she was advised to seek medical attention.  -     TSH -     T4, free -     CBC with Differential/Platelet -     Comprehensive metabolic panel  Hypothyroidism, unspecified type Given symptoms, will re-check labs today. Will adjust dosage prn based on labs. -     TSH -     T4, free  Chest tightness No red flags on exam. Symptoms not exertional. Current ASCVD score is 1.5%. EKG  tracing is personally reviewed.  EKG notes NSR.  No acute changes. Patient has anxiety about father's hx of MI. Offered referral to cardiology for reassurance and/or possible calcium scoring, however she declined at this time. If symptoms return or worsen, she was advised to seek medical attention. -     EKG 12-Lead -     CBC with Differential/Platelet -     Comprehensive metabolic panel   . Reviewed expectations re: course of current medical issues. . Discussed self-management of symptoms. . Outlined signs and symptoms indicating need for more acute intervention. . Patient verbalized understanding and all questions were answered. Marland Kitchen See  orders for this visit as documented in the electronic medical record. . Patient received an After-Visit Summary.  CMA or LPN served as scribe during this visit. History, Physical, and Plan performed by medical provider. Documentation and orders reviewed and attested to.  [name], PA-C

## 2017-12-09 LAB — CBC WITH DIFFERENTIAL/PLATELET
BASOS PCT: 1 %
Basophils Absolute: 73 cells/uL (ref 0–200)
EOS PCT: 4.5 %
Eosinophils Absolute: 329 cells/uL (ref 15–500)
HEMATOCRIT: 32.9 % — AB (ref 35.0–45.0)
Hemoglobin: 10.7 g/dL — ABNORMAL LOW (ref 11.7–15.5)
LYMPHS ABS: 1956 {cells}/uL (ref 850–3900)
MCH: 27.3 pg (ref 27.0–33.0)
MCHC: 32.5 g/dL (ref 32.0–36.0)
MCV: 83.9 fL (ref 80.0–100.0)
MPV: 12.4 fL (ref 7.5–12.5)
Monocytes Relative: 5.7 %
NEUTROS ABS: 4526 {cells}/uL (ref 1500–7800)
Neutrophils Relative %: 62 %
Platelets: 277 10*3/uL (ref 140–400)
RBC: 3.92 10*6/uL (ref 3.80–5.10)
RDW: 13 % (ref 11.0–15.0)
Total Lymphocyte: 26.8 %
WBC mixed population: 416 cells/uL (ref 200–950)
WBC: 7.3 10*3/uL (ref 3.8–10.8)

## 2017-12-24 ENCOUNTER — Other Ambulatory Visit: Payer: Self-pay | Admitting: Physician Assistant

## 2017-12-24 ENCOUNTER — Ambulatory Visit: Payer: Self-pay

## 2017-12-24 MED ORDER — OSELTAMIVIR PHOSPHATE 75 MG PO CAPS: 75.0000 mg | ORAL_CAPSULE | Freq: Every day | ORAL | 0 refills | Status: DC

## 2017-12-24 NOTE — Telephone Encounter (Signed)
   Reason for Disposition . Health Information question, no triage required and triager able to answer question  Answer Assessment - Initial Assessment Questions 1. REASON FOR CALL or QUESTION: "What is your reason for calling today?" or "How can I best help you?" or "What question do you have that I can help answer?"     Pt asking if Tamiflu can be called in. Grandson spent the night with pt and spouse just tested positive for the flu today. Both pt and spouse are going to [place] would like to have in case.  Med can be called to [facility] in [place] Pt asking for call if Tamiflu is called in.  Protocols used: INFORMATION ONLY CALL-A-AH

## 2017-12-24 NOTE — Telephone Encounter (Signed)
Left detailed message on personal voicemail Rx for Tamiflu was sent to pharmacy as requested.

## 2017-12-24 NOTE — Telephone Encounter (Signed)
Sent. Please inform patient

## 2017-12-24 NOTE — Telephone Encounter (Signed)
See note

## 2017-12-24 NOTE — Telephone Encounter (Signed)
Please see message. °

## 2018-03-02 ENCOUNTER — Other Ambulatory Visit: Payer: Self-pay | Admitting: Family Medicine

## 2018-03-09 ENCOUNTER — Other Ambulatory Visit: Payer: Self-pay | Admitting: Family Medicine

## 2018-03-09 DIAGNOSIS — E039 Hypothyroidism, unspecified: Secondary | ICD-10-CM

## 2018-03-11 NOTE — Telephone Encounter (Signed)
PCP [name]

## 2018-03-13 ENCOUNTER — Other Ambulatory Visit: Payer: Self-pay | Admitting: Obstetrics and Gynecology

## 2018-03-13 DIAGNOSIS — N644 Mastodynia: Secondary | ICD-10-CM

## 2018-03-13 DIAGNOSIS — N6320 Unspecified lump in the left breast, unspecified quadrant: Secondary | ICD-10-CM

## 2018-03-18 ENCOUNTER — Ambulatory Visit
Admission: RE | Admit: 2018-03-18 | Discharge: 2018-03-18 | Disposition: A | Discharge status: Home / Self Care | Payer: Commercial Managed Care - PPO | Source: Ambulatory Visit | Attending: Obstetrics and Gynecology | Admitting: Obstetrics and Gynecology

## 2018-03-18 DIAGNOSIS — N644 Mastodynia: Secondary | ICD-10-CM

## 2018-03-18 DIAGNOSIS — N6320 Unspecified lump in the left breast, unspecified quadrant: Secondary | ICD-10-CM

## 2018-05-11 ENCOUNTER — Other Ambulatory Visit: Payer: Self-pay | Admitting: Physician Assistant

## 2018-05-11 ENCOUNTER — Telehealth: Payer: Self-pay | Admitting: Physician Assistant

## 2018-05-11 DIAGNOSIS — E039 Hypothyroidism, unspecified: Secondary | ICD-10-CM

## 2018-05-11 NOTE — Telephone Encounter (Signed)
Labs faxed to Dr. Ronita Hipps at [facility] office.

## 2018-05-11 NOTE — Telephone Encounter (Signed)
Copied from [place] [id number]. Topic: Quick Communication - Rx Refill/Question >> May 11, 2018  3:51 PM Gardiner Ramus wrote: Medication:SYNTHROID 50 MCG tablet [[id number]]  only 2 day left   Has the patient contacted their pharmacy?yes  Preferred Pharmacy (with phone number or street name):[facility], [place] [phone] (Phone) [phone] (Fax)      Agent: Please be advised that RX refills may take up to 3 business days. We ask that you follow-up with your pharmacy.

## 2018-05-11 NOTE — Telephone Encounter (Signed)
See note.   Copied from [place] [id number]. Topic: General - Other >> May 11, 2018 10:36 AM Chauncey Mann A wrote: Reason for CRM: Pt. Called and wanted to make sure Dr. Brien Few of [facility] was aware of her TSH lab results from February. Please ensure he is notified.   Thank you  >> May 11, 2018 11:32 AM Virl Cagey, CMA wrote: Will forward to [facility]

## 2018-05-12 MED ORDER — SYNTHROID 50 MCG PO TABS: ORAL_TABLET | ORAL | 0 refills | Status: DC

## 2018-05-12 NOTE — Telephone Encounter (Signed)
Left detailed message on personal voicemail Rx sent to pharmacy. Please keep appt scheduled at the end of the month.

## 2018-05-12 NOTE — Addendum Note (Signed)
Addended by: Marian Sorrow on: 05/12/2018 08:22 AM   Modules accepted: Orders

## 2018-05-26 ENCOUNTER — Ambulatory Visit (INDEPENDENT_AMBULATORY_CARE_PROVIDER_SITE_OTHER): Payer: Commercial Managed Care - PPO | Admitting: Physician Assistant

## 2018-05-26 ENCOUNTER — Other Ambulatory Visit (HOSPITAL_COMMUNITY)
Admission: RE | Admit: 2018-05-26 | Discharge: 2018-05-26 | Disposition: A | Discharge status: Home / Self Care | Payer: Commercial Managed Care - PPO | Source: Ambulatory Visit | Attending: Physician Assistant | Admitting: Physician Assistant

## 2018-05-26 ENCOUNTER — Encounter: Payer: Self-pay | Admitting: Physician Assistant

## 2018-05-26 VITALS — BP 120/88 | HR 89 | Temp 98.2°F | Ht 63.0 in | Wt 151.0 lb

## 2018-05-26 DIAGNOSIS — R350 Frequency of micturition: Secondary | ICD-10-CM

## 2018-05-26 DIAGNOSIS — J069 Acute upper respiratory infection, unspecified: Secondary | ICD-10-CM | POA: Diagnosis not present

## 2018-05-26 DIAGNOSIS — I10 Essential (primary) hypertension: Secondary | ICD-10-CM | POA: Diagnosis not present

## 2018-05-26 LAB — POCT URINALYSIS DIPSTICK
Bilirubin, UA: NEGATIVE
GLUCOSE UA: NEGATIVE
Ketones, UA: NEGATIVE
LEUKOCYTES UA: NEGATIVE
Nitrite, UA: NEGATIVE
Protein, UA: NEGATIVE
RBC UA: NEGATIVE
Spec Grav, UA: 1.01 (ref 1.010–1.025)
Urobilinogen, UA: 0.2 E.U./dL
pH, UA: 6 (ref 5.0–8.0)

## 2018-05-26 MED ORDER — LISINOPRIL 10 MG PO TABS: 10.0000 mg | ORAL_TABLET | Freq: Every day | ORAL | 1 refills | Status: DC

## 2018-05-26 MED ORDER — AMOXICILLIN-POT CLAVULANATE 875-125 MG PO TABS: 1.0000 | ORAL_TABLET | Freq: Two times a day (BID) | ORAL | 0 refills | Status: DC

## 2018-05-26 NOTE — Patient Instructions (Signed)
It was great to see you!  We will contact you regarding your lab results. If you feel your urinary symptoms are returning or your upper respiratory infection worsens, start the Augmentin. We will let you know if a different antibiotic is needed based of the sample taken today.

## 2018-05-26 NOTE — Progress Notes (Signed)
Cassandra Warren is a 52 y.o. female is here to discuss: Hypertension   History of Present Illness:   Chief Complaint  Patient presents with  . Hypertension  . Urinary Frequency  . bladder spasms    Hypertension  This is a chronic problem. Episode onset: Pt here for follow up on Bp, does not take Bp at home has it checked at work occassionally. Last it was checked it was normal per pt. The problem has been gradually improving since onset. The problem is controlled. Pertinent negatives include no blurred vision, chest pain, headaches, peripheral edema or shortness of breath. The current treatment provides moderate improvement. There are no compliance problems.   Urinary Frequency   This is a recurrent problem. Episode onset: Pt was treated last Thursday for UTI was on Macrobid for 5 days BID, took last dose today and is still c/o bladder spasms and increase in urination today.  The problem occurs every urination. The problem has been gradually worsening. The quality of the pain is described as aching. The pain is mild. There has been no fever. Associated symptoms include frequency. Pertinent negatives include no chills, flank pain, hematuria, nausea or vomiting. Associated symptoms comments: Bladder spasms. She has tried antibiotics (Macrobid 100 mg BID X 5 days, took last dose today) for the symptoms. The treatment provided moderate relief. Her past medical history is significant for recurrent UTIs. There is no history of kidney stones.   She had a UA done at her work, states that the urine dip in the office showed some bacteria, culture was negative.  Denies vaginal discharge.  She is also having some early viral URI symptoms -- has congestion, scratchy throat, runny nose. She is not having fever, cough, SOB. Denies ear pain. Has not tried anything for her symptoms. She is about to go out of town for vacation.    Health Maintenance Due  Topic Date Due  . HIV Screening  07/01/1981     Past Medical History:  Diagnosis Date  . Hypertension   . Thyroid disease      Social History   Socioeconomic History  . Marital status: Married    Spouse name: Not on file  . Number of children: Not on file  . Years of education: Not on file  . Highest education level: Not on file  Occupational History  . Not on file  Social Needs  . Financial resource strain: Not on file  . Food insecurity:    Worry: Not on file    Inability: Not on file  . Transportation needs:    Medical: Not on file    Non-medical: Not on file  Tobacco Use  . Smoking status: Never Smoker  . Smokeless tobacco: Never Used  Substance and Sexual Activity  . Alcohol use: Yes  . Drug use: No  . Sexual activity: Yes    Birth control/protection: None  Lifestyle  . Physical activity:    Days per week: Not on file    Minutes per session: Not on file  . Stress: Not on file  Relationships  . Social connections:    Talks on phone: Not on file    Gets together: Not on file    Attends religious service: Not on file    Active member of club or organization: Not on file    Attends meetings of clubs or organizations: Not on file    Relationship status: Not on file  . Intimate partner violence:  Fear of current or ex partner: Not on file    Emotionally abused: Not on file    Physically abused: Not on file    Forced sexual activity: Not on file  Other Topics Concern  . Not on file  Social History Narrative  . Not on file    Past Surgical History:  Procedure Laterality Date  . BREAST CYST ASPIRATION Left   . CESAREAN SECTION      Family History  Problem Relation Age of Onset  . Diabetes Mother   . Heart disease Father   . Mental illness Brother   . Breast cancer Maternal Aunt     PMHx, SurgHx, SocialHx, FamHx, Medications, and Allergies were reviewed in the Visit Navigator and updated as appropriate.   Patient Active Problem List   Diagnosis Date Noted  . Kidney lesion 09/29/2017  .  Essential hypertension 05/30/2017  . Hypothyroidism 04/12/2017  . Low serum ferritin level 04/12/2017  . Overweight (BMI 25.0-29.9) 04/12/2017    Social History   Tobacco Use  . Smoking status: Never Smoker  . Smokeless tobacco: Never Used  Substance Use Topics  . Alcohol use: Yes  . Drug use: No    Current Medications and Allergies:    Current Outpatient Medications:  .  lisinopril (PRINIVIL,ZESTRIL) 10 MG tablet, Take 1 tablet (10 mg total) by mouth daily., Disp: 90 tablet, Rfl: 1 .  SYNTHROID 50 MCG tablet, TAKE 1 TABLET IN THE MORNING ON AN EMPTY STOMACH., Disp: 30 tablet, Rfl: 0 .  amoxicillin-clavulanate (AUGMENTIN) 875-125 MG tablet, Take 1 tablet by mouth 2 (two) times daily., Disp: 20 tablet, Rfl: 0   Allergies  Allergen Reactions  . Erythromycin Other (See Comments)    Hands and feet tingling  . Morphine And Related Rash    Review of Systems   Review of Systems  Constitutional: Negative for chills.  Eyes: Negative for blurred vision.  Respiratory: Negative for shortness of breath.   Cardiovascular: Negative for chest pain.  Gastrointestinal: Negative for nausea and vomiting.  Genitourinary: Positive for frequency. Negative for flank pain and hematuria.  Neurological: Negative for headaches.    Vitals:   Vitals:   05/26/18 1553  BP: 120/88  Pulse: 89  Temp: 98.2 F (36.8 C)  TempSrc: Oral  SpO2: 97%  Weight: 151 lb 0.4 oz (68.5 kg)  Height: 5\' 3"  (1.6 m)     Body mass index is 26.75 kg/m.   Physical Exam:    Physical Exam  Constitutional: She appears well-developed. She is cooperative.  Non-toxic appearance. She does not have a sickly appearance. She does not appear ill. No distress.  HENT:  Head: Normocephalic and atraumatic.  Right Ear: Tympanic membrane, external ear and ear canal normal. Tympanic membrane is not erythematous, not retracted and not bulging.  Left Ear: Tympanic membrane, external ear and ear canal normal. Tympanic  membrane is not erythematous, not retracted and not bulging.  Nose: Nose normal. Right sinus exhibits no maxillary sinus tenderness and no frontal sinus tenderness. Left sinus exhibits no maxillary sinus tenderness and no frontal sinus tenderness.  Mouth/Throat: Uvula is midline and mucous membranes are normal. Posterior oropharyngeal erythema present. No posterior oropharyngeal edema. Tonsils are 0 on the right. Tonsils are 0 on the left.  Eyes: Conjunctivae and lids are normal.  Neck: Trachea normal.  Cardiovascular: Normal rate, regular rhythm, S1 normal, S2 normal and normal heart sounds.  Pulmonary/Chest: Effort normal and breath sounds normal. She has no decreased breath sounds. She  has no wheezes. She has no rhonchi. She has no rales.  Abdominal: Normal appearance and bowel sounds are normal. There is no tenderness. There is no rigidity, no rebound, no guarding, no CVA tenderness, no tenderness at McBurney's point and negative Murphy's sign.  Lymphadenopathy:    She has no cervical adenopathy.  Neurological: She is alert.  Skin: Skin is warm, dry and intact.  Psychiatric: She has a normal mood and affect. Her speech is normal and behavior is normal.  Nursing note and vitals reviewed.  Results for orders placed or performed in visit on 05/26/18  POCT urinalysis dipstick  Result Value Ref Range   Color, UA Yellow    Clarity, UA Clear    Glucose, UA Negative Negative   Bilirubin, UA Negative    Ketones, UA Negative    Spec Grav, UA 1.010 1.010 - 1.025   Blood, UA Negative    pH, UA 6.0 5.0 - 8.0   Protein, UA Negative Negative   Urobilinogen, UA 0.2 0.2 or 1.0 E.U./dL   Nitrite, UA Negative    Leukocytes, UA Negative Negative   Appearance     Odor       Assessment and Plan:    Daissy was seen today for hypertension, urinary frequency and bladder spasms.  Diagnoses and all orders for this visit:  Urinary frequency UA negative. Patient did collect self swab to check for  yeast and BV. Recommend hydration. We will call with results and any treatment that is needed. -     POCT urinalysis dipstick -     Cervicovaginal ancillary only  Upper respiratory tract infection, unspecified type No red flags on exam.  Will initiate OTC supportive care. Reviewed return precautions including worsening fever, SOB, worsening cough or other concerns. Push fluids and rest. I recommend that patient follow-up if symptoms worsen or persist despite treatment x 7-10 days, sooner if needed.  I did provide her with a safety net prescription to have on hand while traveling. I gave her a paper prescription of Augment should she feel like she need it for urinary symptoms and/or worsening URI.  Essential hypertension Controlled presently. Continue Lisinopril 10 mg. Follow-up in 6 months.  Other orders -     amoxicillin-clavulanate (AUGMENTIN) 875-125 MG tablet; Take 1 tablet by mouth 2 (two) times daily. -     lisinopril (PRINIVIL,ZESTRIL) 10 MG tablet; Take 1 tablet (10 mg total) by mouth daily.  . Reviewed expectations re: course of current medical issues. . Discussed self-management of symptoms. . Outlined signs and symptoms indicating need for more acute intervention. . Patient verbalized understanding and all questions were answered. . See orders for this visit as documented in the electronic medical record. . Patient received an After Visit Summary.  CMA or LPN served as scribe during this visit. History, Physical, and Plan performed by medical provider. Documentation and orders reviewed and attested to.   [name], PA-C [place], [place] 05/27/2018  Follow-up: No follow-ups on file.

## 2018-05-27 ENCOUNTER — Encounter: Payer: Self-pay | Admitting: Physician Assistant

## 2018-05-28 LAB — CERVICOVAGINAL ANCILLARY ONLY
Bacterial vaginitis: NEGATIVE
Candida vaginitis: NEGATIVE

## 2018-06-01 ENCOUNTER — Encounter: Payer: Self-pay | Admitting: *Deleted

## 2018-06-01 ENCOUNTER — Telehealth: Payer: Self-pay | Admitting: Physician Assistant

## 2018-06-01 NOTE — Telephone Encounter (Signed)
Pt given lab results and documented in result note.  

## 2018-06-01 NOTE — Telephone Encounter (Signed)
Copied from [place] [id number]. Topic: Quick Communication - Lab Results >> May 29, 2018 12:10 PM Marian Sorrow, LPN wrote: Called patient to inform them of 7/30 lab results. When patient returns call, triage nurse may disclose results. Please call [phone]

## 2018-06-08 ENCOUNTER — Telehealth: Payer: Self-pay | Admitting: Physician Assistant

## 2018-06-08 DIAGNOSIS — E039 Hypothyroidism, unspecified: Secondary | ICD-10-CM

## 2018-06-08 MED ORDER — SYNTHROID 50 MCG PO TABS: ORAL_TABLET | ORAL | 0 refills | Status: DC

## 2018-06-08 NOTE — Telephone Encounter (Signed)
Copied from [place] [id number]. Topic: Quick Communication - Rx Refill/Question >> Jun 08, 2018  4:40 PM Robina Ade, Helene Kelp D wrote: Medication: SYNTHROID 50 MCG tablet for 90 days if longer will be great.  Has the patient contacted their pharmacy? Yes, pt is completely out of medication.  (Agent: If no, request that the patient contact the pharmacy for the refill.) (Agent: If yes, when and what did the pharmacy advise?)  Preferred Pharmacy (with phone number or street name):[facility]  Agent: Please be advised that RX refills may take up to 3 business days. We ask that you follow-up with your pharmacy.

## 2018-06-10 ENCOUNTER — Other Ambulatory Visit: Payer: Self-pay

## 2018-06-10 DIAGNOSIS — E039 Hypothyroidism, unspecified: Secondary | ICD-10-CM

## 2018-06-10 MED ORDER — SYNTHROID 50 MCG PO TABS: ORAL_TABLET | ORAL | 0 refills | Status: DC

## 2018-07-07 LAB — HM PAP SMEAR: HM Pap smear: NEGATIVE

## 2018-07-08 NOTE — Telephone Encounter (Signed)
Pt is requesting lab results from February with TSH and T4 to be faxed to her   Fax# [phone]

## 2018-07-08 NOTE — Telephone Encounter (Signed)
Labs faxed to pt as requested.

## 2018-07-27 LAB — T4, FREE: T4,FREE (DIRECT): 1.53

## 2018-07-27 LAB — CBC AND DIFFERENTIAL
HCT: 36 (ref 36–46)
Hemoglobin: 11.9 — AB (ref 12.0–16.0)
WBC: 5.4

## 2018-07-27 LAB — TSH: TSH: 1.28 (ref 0.41–5.90)

## 2018-07-29 ENCOUNTER — Encounter: Payer: Self-pay | Admitting: Physician Assistant

## 2018-07-29 LAB — IRON: Iron: 67

## 2018-07-29 LAB — FERRITIN, SERUM (SERIAL): FERRITIN: 16

## 2018-08-07 ENCOUNTER — Telehealth: Payer: Self-pay | Admitting: Physician Assistant

## 2018-08-07 NOTE — Telephone Encounter (Signed)
Please see message and advise 

## 2018-08-07 NOTE — Telephone Encounter (Signed)
Left detailed message on voicemail Cassandra Warren said Per guidelines, she does not need rabies shot if it was from a squirrel as they are considered low-risk for rabies. It is important to watch the wound for any signs of infection. Seek medical attention if any concerns. Any questions please call.

## 2018-08-07 NOTE — Telephone Encounter (Signed)
Copied from [place] [id number]. Topic: Quick Communication - See Telephone Encounter >> Aug 07, 2018 10:01 AM Ahmed Prima L wrote: CRM for notification. See Telephone encounter for: 08/07/18.  Patient states that she is in [place] and was bitten by a squirrel. She did go to a local emergency room and they gave her a tetanus shot. They told her there was not much more they could do. She is wondering if [name] thinks she needs to have a rabies shot or not? She will be available to speak with someone today only in the next 2 hours as they are traveling and there is not much signal.

## 2018-08-07 NOTE — Telephone Encounter (Signed)
Per guidelines, she does not need rabies shot if it was from a squirrel as they are considered low-risk for rabies. It is important to watch the wound for any signs of infection. Seek medical attention if any concerns.

## 2018-11-25 ENCOUNTER — Other Ambulatory Visit: Payer: Self-pay | Admitting: Obstetrics and Gynecology

## 2018-11-25 DIAGNOSIS — Z1231 Encounter for screening mammogram for malignant neoplasm of breast: Secondary | ICD-10-CM

## 2018-12-24 ENCOUNTER — Ambulatory Visit
Admission: RE | Admit: 2018-12-24 | Discharge: 2018-12-24 | Disposition: A | Discharge status: Home / Self Care | Payer: Commercial Managed Care - PPO | Source: Ambulatory Visit | Attending: Obstetrics and Gynecology | Admitting: Obstetrics and Gynecology

## 2018-12-24 DIAGNOSIS — Z1231 Encounter for screening mammogram for malignant neoplasm of breast: Secondary | ICD-10-CM

## 2019-01-22 ENCOUNTER — Telehealth: Payer: Self-pay | Admitting: *Deleted

## 2019-01-22 NOTE — Telephone Encounter (Signed)
Left message on voicemail to call office. Received refill request for blood pressure medication. Pt needs to schedule Webex with Aldona Bar has not been seen since July 2019.

## 2019-01-25 ENCOUNTER — Telehealth: Payer: Self-pay | Admitting: Physician Assistant

## 2019-01-25 NOTE — Telephone Encounter (Signed)
Please call patient and schedule Webex so we can discuss hypertension and refill her medication.  Cassandra Warren

## 2019-01-25 NOTE — Telephone Encounter (Signed)
Called pt to schedule webex. Pt stated she was seen by a nurse at work this am and she is going to fax over results of everything that was done. Pt was able to get an emergency refill of the meds she needed from this nurse as well. Pt scheduled for June for f.u

## 2019-01-25 NOTE — Telephone Encounter (Signed)
Patient was returning office call. She wants to talk to someone about if she really has to come to the office or can just send her Rx to the pharmacy. Patient wish not to come in. Please call patient back, thanks.

## 2019-01-25 NOTE — Telephone Encounter (Signed)
Please call pt and schedule Webex for Hypertension and refill.

## 2019-01-25 NOTE — Telephone Encounter (Signed)
Noted  

## 2019-01-26 LAB — HEPATIC FUNCTION PANEL
ALT: 15 (ref 7–35)
AST: 18 (ref 13–35)
Alkaline Phosphatase: 74 (ref 25–125)
Bilirubin, Total: 0.6

## 2019-01-26 LAB — IRON,TIBC AND FERRITIN PANEL
Ferritin: 12
Iron: 30

## 2019-01-26 LAB — BASIC METABOLIC PANEL
BUN: 12 (ref 4–21)
Creatinine: 1.1 (ref 0.5–1.1)
Glucose: 88
Potassium: 4.5 (ref 3.4–5.3)
Sodium: 141 (ref 137–147)

## 2019-01-26 LAB — TSH: TSH: 1.67 (ref 0.41–5.90)

## 2019-01-26 LAB — VITAMIN D 25 HYDROXY (VIT D DEFICIENCY, FRACTURES): Vit D, 25-Hydroxy: 37.5

## 2019-01-26 LAB — CBC AND DIFFERENTIAL
HCT: 32 — AB (ref 36–46)
Hemoglobin: 10.4 — AB (ref 12.0–16.0)
WBC: 6.2

## 2019-03-26 NOTE — Telephone Encounter (Signed)
Called pt to schedule 6/5 visit as doxy. Pt stated she did not think she needed the visit because she was waiting on Sam to review results to send in her refill. I told pt I would call back and let her know for sure if she needed the virtual visit. Please advise.

## 2019-04-02 ENCOUNTER — Ambulatory Visit (INDEPENDENT_AMBULATORY_CARE_PROVIDER_SITE_OTHER): Payer: Commercial Managed Care - PPO | Admitting: Physician Assistant

## 2019-04-02 ENCOUNTER — Encounter: Payer: Self-pay | Admitting: Physician Assistant

## 2019-04-02 DIAGNOSIS — I10 Essential (primary) hypertension: Secondary | ICD-10-CM | POA: Diagnosis not present

## 2019-04-02 DIAGNOSIS — Z1211 Encounter for screening for malignant neoplasm of colon: Secondary | ICD-10-CM | POA: Diagnosis not present

## 2019-04-02 MED ORDER — LISINOPRIL 10 MG PO TABS: 10.0000 mg | ORAL_TABLET | Freq: Every day | ORAL | 1 refills | Status: DC

## 2019-04-02 NOTE — Progress Notes (Signed)
Virtual Visit via Video   I connected with Cassandra Warren on 04/02/19 at  3:00 PM EDT by a video enabled telemedicine application and verified that I am speaking with the correct person using two identifiers. Location patient: Home Location provider: [facility], Office Persons participating in the virtual visit: Sada Mazzoni, [name] PA-C, Josepha Pigg CMA  I discussed the limitations of evaluation and management by telemedicine and the availability of in person appointments. The patient expressed understanding and agreed to proceed.  Jordan Hawks CMA. acting as a Education administrator for [employer], PA.,have documented all relevant documentation on the behalf of [name], PA,as directed by  [name], PA while in the presence of [name], [place].  Subjective:   HPI:   HTN Currently taking Lisinopril 10 mg. At home blood pressure readings are: "normal per patient." Patient denies chest pain, SOB, blurred vision, dizziness, unusual headaches, lower leg swelling. Patient is compliant with medication. Denies excessive caffeine intake, stimulant usage, excessive alcohol intake, or increase in salt consumption.  Colon cancer screening Has never had colonoscopy. Has heard about Cologuard and is interested in this.  ROS: See pertinent positives and negatives per HPI.  Patient Active Problem List   Diagnosis Date Noted  . Kidney lesion 09/29/2017  . Essential hypertension 05/30/2017  . Hypothyroidism 04/12/2017  . Low serum ferritin level 04/12/2017  . Overweight (BMI 25.0-29.9) 04/12/2017    Social History   Tobacco Use  . Smoking status: Never Smoker  . Smokeless tobacco: Never Used  Substance Use Topics  . Alcohol use: Yes    Current Outpatient Medications:  .  lisinopril (ZESTRIL) 10 MG tablet, Take 1 tablet (10 mg total) by mouth daily., Disp: 90 tablet, Rfl: 1 .  SYNTHROID 50 MCG tablet, TAKE 1 TABLET IN THE MORNING ON AN EMPTY STOMACH.,  Disp: 30 tablet, Rfl: 0  Allergies  Allergen Reactions  . Erythromycin Other (See Comments)    Hands and feet tingling  . Morphine And Related Rash    Objective:   VITALS: Per patient if applicable, see vitals. GENERAL: Alert, appears well and in no acute distress. HEENT: Atraumatic, conjunctiva clear, no obvious abnormalities on inspection of external nose and ears. NECK: Normal movements of the head and neck. CARDIOPULMONARY: No increased WOB. Speaking in clear sentences. I:E ratio WNL.  MS: Moves all visible extremities without noticeable abnormality. PSYCH: Pleasant and cooperative, well-groomed. Speech normal rate and rhythm. Affect is appropriate. Insight and judgement are appropriate. Attention is focused, linear, and appropriate.  NEURO: CN grossly intact. Oriented as arrived to appointment on time with no prompting. Moves both UE equally.  SKIN: No obvious lesions, wounds, erythema, or cyanosis noted on face or hands.  Assessment and Plan:   Cassandra Warren was seen today for follow-up and hypertension.  Diagnoses and all orders for this visit:  Essential hypertension Controlled. Refill lisinopril 10 mg daily. Follow-up in 1 year, sooner if concerns.  Special screening for malignant neoplasms, colon -     Cologuard  Other orders -     lisinopril (ZESTRIL) 10 MG tablet; Take 1 tablet (10 mg total) by mouth daily.  . Reviewed expectations re: course of current medical issues. . Discussed self-management of symptoms. . Outlined signs and symptoms indicating need for more acute intervention. . Patient verbalized understanding and all questions were answered. Marland Kitchen Health Maintenance issues including appropriate healthy diet, exercise, and smoking avoidance were discussed with patient. . See orders for this visit as documented  in the electronic medical record.  I discussed the assessment and treatment plan with the patient. The patient was provided an opportunity to ask questions and  all were answered. The patient agreed with the plan and demonstrated an understanding of the instructions.   The patient was advised to call back or seek an in-person evaluation if the symptoms worsen or if the condition fails to improve as anticipated.   CMA or LPN served as scribe during this visit. History, Physical, and Plan performed by medical provider. The above documentation has been reviewed and is accurate and complete.  [place], [place] 04/02/2019

## 2019-04-15 ENCOUNTER — Encounter: Payer: Self-pay | Admitting: Physician Assistant

## 2019-05-04 LAB — COLOGUARD: Cologuard: NEGATIVE

## 2019-06-03 ENCOUNTER — Telehealth: Payer: Self-pay

## 2019-06-03 ENCOUNTER — Encounter: Payer: Self-pay | Admitting: Physician Assistant

## 2019-06-03 NOTE — Telephone Encounter (Signed)
Left message on voicemail to call office. Please tell pt her Cologuard was Negative.

## 2019-06-03 NOTE — Telephone Encounter (Signed)
Pt called back she was informed Cologuard was Negative and expressed understanding.

## 2019-06-03 NOTE — Telephone Encounter (Signed)
See note, no further action required.

## 2019-06-03 NOTE — Telephone Encounter (Signed)
Copied from [place] [id number]. Topic: General - Other >> Jun 03, 2019  1:15 PM Celene Kras A wrote: Reason for CRM: Pt called stating she got a letter back from [facility] stating they had sent paperwork to physician. Please advise.

## 2019-07-09 ENCOUNTER — Ambulatory Visit (INDEPENDENT_AMBULATORY_CARE_PROVIDER_SITE_OTHER): Payer: Commercial Managed Care - PPO | Admitting: Physician Assistant

## 2019-07-09 ENCOUNTER — Other Ambulatory Visit: Payer: Self-pay

## 2019-07-09 ENCOUNTER — Encounter: Payer: Self-pay | Admitting: Physician Assistant

## 2019-07-09 VITALS — BP 124/80 | HR 78 | Temp 98.1°F | Ht 63.0 in | Wt 152.4 lb

## 2019-07-09 DIAGNOSIS — K649 Unspecified hemorrhoids: Secondary | ICD-10-CM | POA: Diagnosis not present

## 2019-07-09 MED ORDER — LIDOCAINE HCL 2 % EX GEL: CUTANEOUS | 0 refills | Status: DC

## 2019-07-09 MED ORDER — HYDROCORTISONE ACETATE 25 MG RE SUPP: 25.0000 mg | Freq: Two times a day (BID) | RECTAL | 0 refills | Status: DC

## 2019-07-09 NOTE — Patient Instructions (Signed)
It was great to see you!  Start the suppositories and use lidocaine jelly as needed.  Try to sit less, or sit on a donut pillow/rolled up towel to take pressure off of your bottom.  You will be contacted about your GI appointment. Take care,  [name] PA-C

## 2019-07-09 NOTE — Progress Notes (Signed)
Cassandra Warren is a 53 y.o. female here for a new problem.  I acted as a Education administrator for [employer], PA-C Anselmo Pickler, LPN  History of Present Illness:   Chief Complaint  Patient presents with  . Hemorrhoids    HPI   Hemorrhoids Pt c/o hemorrhoid is flaring for the past 3 weeks and will not go down. No bleeding. She has tried Prep H but no relief. She states that she has intermittent issues with this hemorrhoid throughout her life but this seems to be the longest continuous time it has bothered her. Denies constipation/diarrhea. Is sitting more than normal.  Cologaurd this year was normal.     Past Medical History:  Diagnosis Date  . Hypertension   . Thyroid disease      Social History   Socioeconomic History  . Marital status: Married    Spouse name: Not on file  . Number of children: Not on file  . Years of education: Not on file  . Highest education level: Not on file  Occupational History  . Not on file  Social Needs  . Financial resource strain: Not on file  . Food insecurity    Worry: Not on file    Inability: Not on file  . Transportation needs    Medical: Not on file    Non-medical: Not on file  Tobacco Use  . Smoking status: Never Smoker  . Smokeless tobacco: Never Used  Substance and Sexual Activity  . Alcohol use: Yes  . Drug use: No  . Sexual activity: Yes    Birth control/protection: None  Lifestyle  . Physical activity    Days per week: Not on file    Minutes per session: Not on file  . Stress: Not on file  Relationships  . Social Herbalist on phone: Not on file    Gets together: Not on file    Attends religious service: Not on file    Active member of club or organization: Not on file    Attends meetings of clubs or organizations: Not on file    Relationship status: Not on file  . Intimate partner violence    Fear of current or ex partner: Not on file    Emotionally abused: Not on file    Physically abused: Not on  file    Forced sexual activity: Not on file  Other Topics Concern  . Not on file  Social History Narrative  . Not on file    Past Surgical History:  Procedure Laterality Date  . BREAST CYST ASPIRATION Left   . CESAREAN SECTION      Family History  Problem Relation Age of Onset  . Diabetes Mother   . Heart disease Father   . Mental illness Brother   . Breast cancer Maternal Aunt     Allergies  Allergen Reactions  . Erythromycin Other (See Comments)    Hands and feet tingling  . Morphine And Related Rash  . Penicillin G Palpitations    Current Medications:   Current Outpatient Medications:  .  lisinopril (ZESTRIL) 10 MG tablet, Take 1 tablet (10 mg total) by mouth daily., Disp: 90 tablet, Rfl: 1 .  SYNTHROID 50 MCG tablet, TAKE 1 TABLET IN THE MORNING ON AN EMPTY STOMACH., Disp: 30 tablet, Rfl: 0 .  hydrocortisone (ANUSOL-HC) 25 MG suppository, Place 1 suppository (25 mg total) rectally 2 (two) times daily., Disp: 12 suppository, Rfl: 0 .  lidocaine (XYLOCAINE) 2 %  jelly, Apply to affected area as needed, Disp: 30 mL, Rfl: 0   Review of Systems:   ROS Negative unless otherwise specified per HPI.  Vitals:   Vitals:   07/09/19 0848  BP: 124/80  Pulse: 78  Temp: 98.1 F (36.7 C)  TempSrc: Temporal  SpO2: 100%  Weight: 152 lb 6.1 oz (69.1 kg)  Height: 5\' 3"  (1.6 m)     Body mass index is 26.99 kg/m.  Physical Exam:   Physical Exam Vitals signs and nursing note reviewed. Exam conducted with a chaperone present.  Constitutional:      General: She is not in acute distress.    Appearance: She is well-developed. She is not ill-appearing or toxic-appearing.  HENT:     Head: Normocephalic and atraumatic.  Cardiovascular:     Rate and Rhythm: Normal rate and regular rhythm.     Heart sounds: Normal heart sounds.  Pulmonary:     Effort: Pulmonary effort is normal. No accessory muscle usage or respiratory distress.     Breath sounds: Normal breath sounds.   Genitourinary:    Rectum: External hemorrhoid present.     Comments: Non thrombosed flesh-colored hemorrhoid with TTP at 12 o clock, approximately pea-sized Skin:    General: Skin is warm and dry.  Neurological:     Mental Status: She is alert.  Psychiatric:        Speech: Speech normal.        Behavior: Behavior is cooperative.     Results for orders placed or performed in visit on 06/03/19  Cologuard  Result Value Ref Range   Cologuard Negative Negative    Assessment and Plan:   Cassandra Warren was seen today for hemorrhoids.  Diagnoses and all orders for this visit:  Hemorrhoids, unspecified hemorrhoid type  Other orders -     hydrocortisone (ANUSOL-HC) 25 MG suppository; Place 1 suppository (25 mg total) rectally 2 (two) times daily. -     lidocaine (XYLOCAINE) 2 % jelly; Apply to affected area as needed   No red flags.  Given the chronicity of this hemorrhoid, I will send to GI for further evaluation.  Also go ahead and start Anusol suppositories.  May use lidocaine as needed.  I also recommended that she try to avoid sitting for prolonged periods and use additional cushioning if she is sitting.  Worsening precautions advised..  . Reviewed expectations re: course of current medical issues. . Discussed self-management of symptoms. . Outlined signs and symptoms indicating need for more acute intervention. . Patient verbalized understanding and all questions were answered. . See orders for this visit as documented in the electronic medical record. . Patient received an After-Visit Summary.  CMA or LPN served as scribe during this visit. History, Physical, and Plan performed by medical provider. The above documentation has been reviewed and is accurate and complete.   Cassandra Coke, PA-C

## 2019-07-19 ENCOUNTER — Telehealth: Payer: Self-pay | Admitting: Physical Therapy

## 2019-07-19 DIAGNOSIS — K649 Unspecified hemorrhoids: Secondary | ICD-10-CM

## 2019-07-19 NOTE — Addendum Note (Signed)
Addended by: Marian Sorrow on: 07/19/2019 03:50 PM   Modules accepted: Orders

## 2019-07-19 NOTE — Telephone Encounter (Signed)
Spoke to pt told her referral has been placed for GI and someone will contact you to schedule an appointment. Pt verbalized understanding.

## 2019-07-19 NOTE — Telephone Encounter (Signed)
She needs to have a referral put in please.

## 2019-07-19 NOTE — Telephone Encounter (Signed)
Copied from [place] [id number]. Topic: Referral - Status >> Jul 19, 2019  1:00 PM Erick Blinks wrote: Pt called back requesting call back regarding status of GI referral please advise  Reason for CRM: [phone] leave a VM

## 2019-07-20 ENCOUNTER — Encounter: Payer: Self-pay | Admitting: Gastroenterology

## 2019-08-27 ENCOUNTER — Encounter

## 2019-08-27 ENCOUNTER — Ambulatory Visit (INDEPENDENT_AMBULATORY_CARE_PROVIDER_SITE_OTHER): Payer: Commercial Managed Care - PPO | Admitting: Physician Assistant

## 2019-08-27 ENCOUNTER — Encounter: Payer: Self-pay | Admitting: Physician Assistant

## 2019-08-27 VITALS — BP 112/72 | HR 80 | Temp 98.1°F | Ht 63.0 in | Wt 152.0 lb

## 2019-08-27 DIAGNOSIS — Z1159 Encounter for screening for other viral diseases: Secondary | ICD-10-CM

## 2019-08-27 DIAGNOSIS — K629 Disease of anus and rectum, unspecified: Secondary | ICD-10-CM | POA: Diagnosis not present

## 2019-08-27 DIAGNOSIS — Z1211 Encounter for screening for malignant neoplasm of colon: Secondary | ICD-10-CM | POA: Diagnosis not present

## 2019-08-27 MED ORDER — SUPREP BOWEL PREP KIT 17.5-3.13-1.6 GM/177ML PO SOLN: 1.0000 | Freq: Once | ORAL | 0 refills | Status: AC

## 2019-08-27 NOTE — Patient Instructions (Signed)
Due to recent COVID-19 restrictions implemented by [employer] and state authorities and in an effort to keep both patients and staff as safe as possible, [facility] requires COVID-19 testing prior to any scheduled endoscopic procedure. The testing center is located at [address] [place], [place] [address] in the [facility] [employer]  suite.  Your appointment has been scheduled for 09/08/2019 at 1:30pm.   Please bring your insurance cards to this appointment. You will require your COVID screen 2 business days prior to your endoscopic procedure.  You are not required to quarantine after your screening.  You will only receive a phone call with the results if it is POSITIVE.  If you do not receive a call the day before your procedure you should begin your prep, if ordered, and you should report to the endo center for your procedure at your designated appointment arrival time ( one hour prior to the procedure time). There is no cost to you for the screening on the day of the swab.  [facility] will file with your insurance company for the testing.    You may receive an automated phone call prior to your procedure or have a message in your MyChart that you have an appointment for a BP/15 at the [facility], please disregard this message.  Your testing will be at the [place] [place] location.   If you are leaving [facility] travel [place] on [address], turn left onto [address], turn night onto [address] at the 1st stop light turn right, pass the [employer] on your right and proceed to [place] (white building).    You have been scheduled for a colonoscopy. Please follow written instructions given to you at your visit today.  Please pick up your prep supplies at the pharmacy within the next 1-3 days. If you use inhalers (even only as  needed), please bring them with you on the day of your procedure.   I appreciate the  opportunity to care for you  Thank You   Amy Shane Crutch

## 2019-08-27 NOTE — Progress Notes (Signed)
Subjective:    Patient ID: Cassandra Warren, female    DOB: Mar 15, 1966, 53 y.o.   MRN: [id number]  HPI Cassandra Warren is a pleasant 53 year old white female, new to GI today, referred by [name], PA-C/primary care for evaluation of hemorrhoidal symptoms. Patient has not had any prior GI evaluation.  She says she did do a Cologuard at the beginning of the year which was negative. She generally in good health with history of hypertension and hypothyroidism. She says she has had what she feels are hemorrhoidal symptoms off and on over the past few years.  Over the past 6 to 8 weeks she had noticed an external lesion, which she says feels like a bump.  This is not been particularly painful but she has had some mild pressure sensation, she has not noticed any rectal bleeding.  No changes in bowel habits, no issues with constipation or straining.  She has not had any abdominal pain. She is concerned because this has become a little bit larger over the past several weeks. She had been using Preparation H over-the-counter without any benefit.  She was given Anusol suppositories and lidocaine jelly by primary care, she did not feel the suppositories were helpful either .  Review of Systems Pertinent positive and negative review of systems were noted in the above HPI section.  All other review of systems was otherwise negative.  Outpatient Encounter Medications as of 08/27/2019  Medication Sig  . hydrocortisone (ANUSOL-HC) 25 MG suppository Place 1 suppository (25 mg total) rectally 2 (two) times daily.  Marland Kitchen lidocaine (XYLOCAINE) 2 % jelly Apply to affected area as needed  . lisinopril (ZESTRIL) 10 MG tablet Take 1 tablet (10 mg total) by mouth daily.  Marland Kitchen SYNTHROID 50 MCG tablet TAKE 1 TABLET IN THE MORNING ON AN EMPTY STOMACH.  . Na Sulfate-K Sulfate-Mg Sulf (SUPREP BOWEL PREP KIT) 17.5-3.13-1.6 GM/177ML SOLN Take 1 kit by mouth once for 1 dose.   No facility-administered encounter medications on file as  of 08/27/2019.    Allergies  Allergen Reactions  . Erythromycin Other (See Comments)    Hands and feet tingling  . Morphine And Related Rash  . Penicillin G Palpitations   Patient Active Problem List   Diagnosis Date Noted  . Kidney lesion 09/29/2017  . Essential hypertension 05/30/2017  . Hypothyroidism 04/12/2017  . Low serum ferritin level 04/12/2017  . Overweight (BMI 25.0-29.9) 04/12/2017   Social History   Socioeconomic History  . Marital status: Married    Spouse name: Not on file  . Number of children: Not on file  . Years of education: Not on file  . Highest education level: Not on file  Occupational History  . Not on file  Social Needs  . Financial resource strain: Not on file  . Food insecurity    Worry: Not on file    Inability: Not on file  . Transportation needs    Medical: Not on file    Non-medical: Not on file  Tobacco Use  . Smoking status: Never Smoker  . Smokeless tobacco: Never Used  Substance and Sexual Activity  . Alcohol use: Yes  . Drug use: No  . Sexual activity: Yes    Birth control/protection: None  Lifestyle  . Physical activity    Days per week: Not on file    Minutes per session: Not on file  . Stress: Not on file  Relationships  . Social connections    Talks on phone: Not on  file    Gets together: Not on file    Attends religious service: Not on file    Active member of club or organization: Not on file    Attends meetings of clubs or organizations: Not on file    Relationship status: Not on file  . Intimate partner violence    Fear of current or ex partner: Not on file    Emotionally abused: Not on file    Physically abused: Not on file    Forced sexual activity: Not on file  Other Topics Concern  . Not on file  Social History Narrative  . Not on file    Cassandra Warren's family history includes Breast cancer in her maternal aunt; Diabetes in her mother; Heart disease in her father; Mental illness in her brother.       Objective:    Vitals:   08/27/19 1452  BP: 112/72  Pulse: 80  Temp: 98.1 F (36.7 C)    Physical Exam Well-developed well-nourished white female in no acute distress.  Height, Weight 152, BMI 26.9  HEENT; nontraumatic normocephalic, EOMI, PRE R LA, sclera anicteric. Oropharynx; not examined/mask/Covid Neck; supple, no JVD Cardiovascular; regular rate and rhythm with S1-S2, no murmur rub or gallop Pulmonary; Clear bilaterally Abdomen; soft, nontender, nondistended, no palpable mass or hepatosplenomegaly, bowel sounds are active Rectal;, patient has a white, pearly anal lesion about the size of a pea, this is somewhat mobile and nontender and firm.  She does not have any external hemorrhoids, digital exam is negative.  Stool heme-negative. Skin; benign exam, no jaundice rash or appreciable lesions Extremities; no clubbing cyanosis or edema skin warm and dry Neuro/Psych; alert and oriented x4, grossly nonfocal mood and affect appropriate       Assessment & Plan:   #[birth year] 53 year old white female with a mildly symptomatic anal lesion, not painful or bleeding but causing a sensation of pressure. On exam this is not an external hemorrhoid, does not appear malignant. Rule out possible anal fibroma, rule out anal polyp rule out large hypertrophied anal papillae, though not typical  #2 hypertension #3.  Hypothyroidism  Plan; Patient will be scheduled for colonoscopy with Dr. Havery Moros, possible biopsy of this lesion at time of colonoscopy.  Procedure was discussed in detail with the patient including indications risks and benefits and she is agreeable to proceed. Pending findings at colonoscopy, she may need to be referred to a surgeon for excision.   Haskel Dewalt S Eleny Cortez PA-C 08/27/2019   Cc: [name], [place]

## 2019-08-27 NOTE — Progress Notes (Signed)
Agree with assessment and plan as outlined.  

## 2019-08-29 DIAGNOSIS — K5790 Diverticulosis of intestine, part unspecified, without perforation or abscess without bleeding: Secondary | ICD-10-CM

## 2019-08-29 DIAGNOSIS — Z860100 Personal history of colon polyps, unspecified: Secondary | ICD-10-CM

## 2019-08-29 HISTORY — DX: Personal history of colonic polyps: Z86.010

## 2019-08-29 HISTORY — DX: Diverticulosis of intestine, part unspecified, without perforation or abscess without bleeding: K57.90

## 2019-08-29 HISTORY — DX: Personal history of colon polyps, unspecified: Z86.0100

## 2019-09-03 ENCOUNTER — Ambulatory Visit: Payer: Commercial Managed Care - PPO | Admitting: Gastroenterology

## 2019-09-07 ENCOUNTER — Telehealth: Payer: Self-pay | Admitting: Gastroenterology

## 2019-09-07 NOTE — Telephone Encounter (Signed)
Called patient and let her  Know if is ok to still have her colonoscopy on 09/10/19 even though she is on antibiotics

## 2019-09-08 ENCOUNTER — Other Ambulatory Visit: Payer: Self-pay | Admitting: Gastroenterology

## 2019-09-09 ENCOUNTER — Encounter: Payer: Self-pay | Admitting: Gastroenterology

## 2019-09-09 LAB — SARS CORONAVIRUS 2 (TAT 6-24 HRS): SARS Coronavirus 2: NEGATIVE

## 2019-09-10 ENCOUNTER — Ambulatory Visit (AMBULATORY_SURGERY_CENTER): Payer: Commercial Managed Care - PPO | Admitting: Gastroenterology

## 2019-09-10 ENCOUNTER — Encounter: Payer: Self-pay | Admitting: Gastroenterology

## 2019-09-10 ENCOUNTER — Other Ambulatory Visit: Payer: Self-pay

## 2019-09-10 VITALS — BP 119/43 | HR 70 | Temp 97.9°F | Resp 18 | Ht 63.0 in | Wt 152.0 lb

## 2019-09-10 DIAGNOSIS — K5730 Diverticulosis of large intestine without perforation or abscess without bleeding: Secondary | ICD-10-CM | POA: Diagnosis not present

## 2019-09-10 DIAGNOSIS — R6889 Other general symptoms and signs: Secondary | ICD-10-CM

## 2019-09-10 DIAGNOSIS — K529 Noninfective gastroenteritis and colitis, unspecified: Secondary | ICD-10-CM

## 2019-09-10 DIAGNOSIS — K635 Polyp of colon: Secondary | ICD-10-CM

## 2019-09-10 DIAGNOSIS — K5289 Other specified noninfective gastroenteritis and colitis: Secondary | ICD-10-CM | POA: Diagnosis not present

## 2019-09-10 DIAGNOSIS — D127 Benign neoplasm of rectosigmoid junction: Secondary | ICD-10-CM

## 2019-09-10 HISTORY — PX: COLONOSCOPY: SHX174

## 2019-09-10 MED ORDER — SODIUM CHLORIDE 0.9 % IV SOLN: 500.0000 mL | INTRAVENOUS | Status: DC

## 2019-09-10 NOTE — Patient Instructions (Signed)
One polyp removed.  Dr Havery Moros will mail you a report about this polyp once he has received the lab report.  Please wait 2-3 weeks for this letter in the mail. Diverticulosis in part of the colon (sigmoid).  3rd floor will set up surgery consult and someone will be contacting you next week.    YOU HAD AN ENDOSCOPIC PROCEDURE TODAY AT [facility]:   Refer to the procedure report that was given to you for any specific questions about what was found during the examination.  If the procedure report does not answer your questions, please call your gastroenterologist to clarify.  If you requested that your care partner not be given the details of your procedure findings, then the procedure report has been included in a sealed envelope for you to review at your convenience later.  YOU SHOULD EXPECT: Some feelings of bloating in the abdomen. Passage of more gas than usual.  Walking can help get rid of the air that was put into your GI tract during the procedure and reduce the bloating. If you had a lower endoscopy (such as a colonoscopy or flexible sigmoidoscopy) you may notice spotting of blood in your stool or on the toilet paper. If you underwent a bowel prep for your procedure, you may not have a normal bowel movement for a few days.  Please Note:  You might notice some irritation and congestion in your nose or some drainage.  This is from the oxygen used during your procedure.  There is no need for concern and it should clear up in a day or so.  SYMPTOMS TO REPORT IMMEDIATELY:   Following lower endoscopy (colonoscopy or flexible sigmoidoscopy):  Excessive amounts of blood in the stool  Significant tenderness or worsening of abdominal pains  Swelling of the abdomen that is new, acute  Fever of 100F or higher   For urgent or emergent issues, a gastroenterologist can be reached at any hour by calling [phone].   DIET:  We do recommend a small meal at first, but then you  may proceed to your regular diet.  Drink plenty of fluids but you should avoid alcoholic beverages for 24 hours.  ACTIVITY:  You should plan to take it easy for the rest of today and you should NOT DRIVE or use heavy machinery until tomorrow (because of the sedation medicines used during the test).    FOLLOW UP: Our staff will call the number listed on your records 48-72 hours following your procedure to check on you and address any questions or concerns that you may have regarding the information given to you following your procedure. If we do not reach you, we will leave a message.  We will attempt to reach you two times.  During this call, we will ask if you have developed any symptoms of COVID 19. If you develop any symptoms (ie: fever, flu-like symptoms, shortness of breath, cough etc.) before then, please call [phone].  If you test positive for Covid 19 in the 2 weeks post procedure, please call and report this information to [place].    If any biopsies were taken you will be contacted by phone or by letter within the next 1-3 weeks.  Please call us at [phone] if you have not heard about the biopsies in 3 weeks.    SIGNATURES/CONFIDENTIALITY: You and/or your care partner have signed paperwork which will be entered into your electronic medical record.  These signatures attest to the fact that  that the information above on your After Visit Summary has been reviewed and is understood.  Full responsibility of the confidentiality of this discharge information lies with you and/or your care-partner.

## 2019-09-10 NOTE — Progress Notes (Signed)
PT taken to PACU. Monitors in place. VSS. Report given to RN. 

## 2019-09-10 NOTE — Progress Notes (Signed)
Called to room to assist during endoscopic procedure.  Patient ID and intended procedure confirmed with present staff. Received instructions for my participation in the procedure from the performing physician.  

## 2019-09-10 NOTE — Progress Notes (Signed)
Temp LC V/s  cw

## 2019-09-10 NOTE — Op Note (Signed)
Cassandra Warren Patient Name: Cassandra Warren Procedure Date: 09/10/2019 2:48 PM MRN: [id number] Endoscopist: Remo Lipps P. Havery Moros , MD Age: 53 Referring MD:  Date of Birth: 1966/07/10 Gender: Female Account #: [id number] Procedure:                Colonoscopy Indications:              Abnormal rectal exam - palpable cystic lesion in                            perianal area, no prior colonoscopy Medicines:                Monitored Anesthesia Care Procedure:                Pre-Anesthesia Assessment:                           - Prior to the procedure, a History and Physical                            was performed, and patient medications and                            allergies were reviewed. The patient's tolerance of                            previous anesthesia was also reviewed. The risks                            and benefits of the procedure and the sedation                            options and risks were discussed with the patient.                            All questions were answered, and informed consent                            was obtained. Prior Anticoagulants: The patient has                            taken no previous anticoagulant or antiplatelet                            agents. ASA Grade Assessment: II - A patient with                            mild systemic disease. After reviewing the risks                            and benefits, the patient was deemed in                            satisfactory condition to undergo the procedure.  After obtaining informed consent, the colonoscope                            was passed under direct vision. Throughout the                            procedure, the patient's blood pressure, pulse, and                            oxygen saturations were monitored continuously. The                            Colonoscope was introduced through the anus and                            advanced to the the  terminal ileum, with                            identification of the appendiceal orifice and IC                            valve. The colonoscopy was performed without                            difficulty. The patient tolerated the procedure                            well. The quality of the bowel preparation was                            good. The terminal ileum, ileocecal valve,                            appendiceal orifice, and rectum were photographed. Scope In: 3:07:29 PM Scope Out: 3:28:54 PM Scope Withdrawal Time: 0 hours 17 minutes 35 seconds  Total Procedure Duration: 0 hours 21 minutes 25 seconds  Findings:                 The perianal exam findings include roughly 1cm                            cystic lesion in the left lateral area, hard to                            palpation.                           The terminal ileum appeared normal.                           Patchy mild inflammation characterized by                            granularity was found in the ascending colon and in  the cecum. Biopsies were taken with a cold forceps                            for histology.                           A few small-mouthed diverticula were found in the                            sigmoid colon.                           A diminutive polyp was found in the recto-sigmoid                            colon. The polyp was sessile. The polyp was removed                            with a cold snare. Resection and retrieval were                            complete.                           The exam was otherwise without abnormality. Complications:            No immediate complications. Estimated blood loss:                            Minimal. Estimated Blood Loss:     Estimated blood loss was minimal. Impression:               - Benign appearing cystic lesion found on perianal                            exam as outlined above.                           -  The examined portion of the ileum was normal.                           - Patchy very mild nonspecific inflammation was                            found in the ascending colon and in the cecum.                            Biopsied.                           - Diverticulosis in the sigmoid colon.                           - One diminutive polyp at the recto-sigmoid colon,  removed with a cold snare. Resected and retrieved.                           - The examination was otherwise normal. Recommendation:           - Patient has a contact number available for                            emergencies. The signs and symptoms of potential                            delayed complications were discussed with the                            patient. Return to normal activities tomorrow.                            Written discharge instructions were provided to the                            patient.                           - Resume previous diet.                           - Continue present medications.                           - Await pathology results.                           - Recommend referral to Colorectal surgery to                            further evaluate perianal lesion to discuss removal                            although is benign appearing Remo Lipps P. Zabian Swayne, MD 09/10/2019 3:36:09 PM This report has been signed electronically.

## 2019-09-13 ENCOUNTER — Telehealth: Payer: Self-pay

## 2019-09-13 NOTE — Telephone Encounter (Signed)
-----   Message from Yetta Flock, MD sent at 09/10/2019  4:05 PM EST ----- Regarding: surgical referral Sherlynn Stalls can you please place a routine consult to [facility] colorectal surgery for perianal cystic lesion evaluation. Thanks

## 2019-09-13 NOTE — Telephone Encounter (Signed)
Faxed referral to [facility] for perianal cystic lesion evaluation

## 2019-09-14 ENCOUNTER — Telehealth: Payer: Self-pay

## 2019-09-14 NOTE — Telephone Encounter (Signed)
  Follow up Call-  Call back number 09/10/2019  Post procedure Call Back phone  # [phone]  Permission to leave phone message Yes  Some recent data might be hidden     Patient questions:  Do you have a fever, pain , or abdominal swelling? No. Pain Score  0 *  Have you tolerated food without any problems? Yes.    Have you been able to return to your normal activities? Yes.    Do you have any questions about your discharge instructions: Diet   No. Medications  No. Follow up visit  Yes.    Do you have questions or concerns about your Care? Yes.    Actions: * If pain score is 4 or above: 1. No action needed, pain <4.Have you developed a fever since your procedure? no  2.   Have you had an respiratory symptoms (SOB or cough) since your procedure? no  3.   Have you tested positive for COVID 19 since your procedure no  4.   Have you had any family members/close contacts diagnosed with the COVID 19 since your procedure?  No  Patient had questions about her follow up with the colorectal surgeon.  Stated that she would be getting a call to schedule that appointment.Patient agreeable to that plan.   If yes to any of these questions please route to Joylene John, RN and Alphonsa Gin, RN.

## 2019-09-17 ENCOUNTER — Telehealth: Payer: Self-pay | Admitting: Gastroenterology

## 2019-09-20 ENCOUNTER — Telehealth: Payer: Self-pay

## 2019-09-20 NOTE — Telephone Encounter (Signed)
Cassandra Warren for [facility] returned my call. Patient is scheduled to see them on 10/05/19 at 8:45am. She also left a voice mail on patient's phone about appt.

## 2019-10-05 ENCOUNTER — Ambulatory Visit: Payer: Self-pay | Admitting: Surgery

## 2019-10-05 NOTE — H&P (Signed)
Reynold Bowen Documented: 10/05/2019 8:52 AM Location: [facility] Patient #: [id number] DOB: 07-28-1966 Married / Language: Cleophus Molt / Race: White Female  History of Present Illness Adin Hector MD; 10/05/2019 9:09 AM) The patient is a 52 year old female who presents with anal lesions. Note for "Anal lesions": ` ` ` Patient sent for surgical consultation at the request of Dr. [place] Cellar, [facility]  Chief Complaint: Perianal lesion. ` ` The patient is a healthy woman his noticeable lump near her anus for the past few years. Presumed to be an external hemorrhoid. Felt that on a vacation many years ago with an episode of bleeding. No severe pain. Had a normal: Guarded age 47.Marland Kitchen Somewhat irritating at times. Concern for slightly larger. Sent to see gastroenterology. Did not seem like a classic hemorrhoid. Colonoscopy recommended. Hyperplastic polyp removed. Some nonspecific colitis mildly noted in right colon. Ileum clear. Pathology did not feel it was classic for inflammatory bowel disease. Favored NSAID or infectious related. Patient does note that she had a staging by a stingray and was on antibiotics and steroids for a few weeks this fall. Not really on any nonsteroidals. No obvious fissure or fistula. Surgical consultation requested.  She does not smoke. Not diabetic. No blood thinners. No sleep apnea. Otherwise rather healthy. No personal nor family history of GI/colon cancer, inflammatory bowel disease, irritable bowel syndrome, allergy such as Celiac Sprue, dietary/dairy problems, colitis, ulcers nor gastritis. No recent sick contacts/gastroenteritis. No travel outside the country. No changes in diet. No dysphagia to solids or liquids. No significant heartburn or reflux. No hematochezia, hematemesis, coffee ground emesis. No evidence of prior gastric/peptic ulceration.  (Review of systems as stated in this history (HPI) or in the  review of systems. Otherwise all other 12 point ROS are negative) ` ` `   Past Surgical History Sabino Gasser, [place]; 10/05/2019 8:52 AM) Cesarean Section - 1 Colon Polyp Removal - Colonoscopy  Diagnostic Studies History Sabino Gasser, CMA; 10/05/2019 8:52 AM) Colonoscopy within last year Mammogram within last year Pap Smear 1-5 years ago  Allergies Sabino Gasser, [place]; 10/05/2019 8:53 AM) Erythromycin Derivatives Morphine Derivatives Penicillins Allergies Reconciled  Medication History Sabino Gasser, CMA; 10/05/2019 8:54 AM) Lisinopril (10MG  Tablet, Oral) Active. Synthroid (50MCG Tablet, Oral) Active. Medications Reconciled  Pregnancy / Birth History Sabino Gasser, [place]; 10/05/2019 8:52 AM) Age at menarche 37 years. Gravida 1 Maternal age 75-20 Para 1 Regular periods  Other Problems Sabino Gasser, CMA; 10/05/2019 8:52 AM) High blood pressure Oophorectomy Right. Thyroid Disease     Review of Systems Sabino Gasser CMA; 10/05/2019 8:52 AM) General Not Present- Appetite Loss, Chills, Fatigue, Fever, Night Sweats, Weight Gain and Weight Loss. Skin Not Present- Change in Wart/Mole, Dryness, Hives, Jaundice, New Lesions, Non-Healing Wounds, Rash and Ulcer. HEENT Not Present- Earache, Hearing Loss, Hoarseness, Nose Bleed, Oral Ulcers, Ringing in the Ears, Seasonal Allergies, Sinus Pain, Sore Throat, Visual Disturbances, Wears glasses/contact lenses and Yellow Eyes. Respiratory Not Present- Bloody sputum, Chronic Cough, Difficulty Breathing, Snoring and Wheezing. Breast Not Present- Breast Mass, Breast Pain, Nipple Discharge and Skin Changes. Cardiovascular Not Present- Chest Pain, Difficulty Breathing Lying Down, Leg Cramps, Palpitations, Rapid Heart Rate, Shortness of Breath and Swelling of Extremities. Gastrointestinal Present- Bloating. Not Present- Abdominal Pain, Bloody Stool, Change in Bowel Habits, Chronic diarrhea, Constipation, Difficulty Swallowing,  Excessive gas, Gets full quickly at meals, Hemorrhoids, Indigestion, Nausea, Rectal Pain and Vomiting. Female Genitourinary Not Present- Frequency, Nocturia, Painful Urination, Pelvic Pain and Urgency. Musculoskeletal Not Present-  Back Pain, Joint Pain, Joint Stiffness, Muscle Pain, Muscle Weakness and Swelling of Extremities. Neurological Not Present- Decreased Memory, Fainting, Headaches, Numbness, Seizures, Tingling, Tremor, Trouble walking and Weakness. Psychiatric Not Present- Anxiety, Bipolar, Change in Sleep Pattern, Depression, Fearful and Frequent crying. Endocrine Not Present- Cold Intolerance, Excessive Hunger, Hair Changes, Heat Intolerance, Hot flashes and New Diabetes. Hematology Not Present- Blood Thinners, Easy Bruising, Excessive bleeding, Gland problems, HIV and Persistent Infections.  Vitals Sabino Gasser CMA; 10/05/2019 8:55 AM) 10/05/2019 8:54 AM Weight: 152.6 lb Height: 63in Body Surface Area: 1.72 m Body Mass Index: 27.03 kg/m  Temp.: 98.52F(Tympanic)  Pulse: 115 (Regular)  BP: 110/72 (Sitting, Left Arm, Standard)        Physical Exam Adin Hector MD; 10/05/2019 9:08 AM)  General Mental Status-Alert. General Appearance-Not in acute distress, Not Sickly. Orientation-Oriented X3. Hydration-Well hydrated. Voice-Normal.  Integumentary Global Assessment Upon inspection and palpation of skin surfaces of the - Axillae: non-tender, no inflammation or ulceration, no drainage. and Distribution of scalp and body hair is normal. General Characteristics Temperature - normal warmth is noted.  Head and Neck Head-normocephalic, atraumatic with no lesions or palpable masses. Face Global Assessment - atraumatic, no absence of expression. Neck Global Assessment - no abnormal movements, no bruit auscultated on the right, no bruit auscultated on the left, no decreased range of motion, non-tender. Trachea-midline. Thyroid Gland  Characteristics - non-tender.  Eye Eyeball - Left-Extraocular movements intact, No Nystagmus - Left. Eyeball - Right-Extraocular movements intact, No Nystagmus - Right. Cornea - Left-No Hazy - Left. Cornea - Right-No Hazy - Right. Sclera/Conjunctiva - Left-No scleral icterus, No Discharge - Left. Sclera/Conjunctiva - Right-No scleral icterus, No Discharge - Right. Pupil - Left-Direct reaction to light normal. Pupil - Right-Direct reaction to light normal.  ENMT Ears Pinna - Left - no drainage observed, no generalized tenderness observed. Pinna - Right - no drainage observed, no generalized tenderness observed. Nose and Sinuses External Inspection of the Nose - no destructive lesion observed. Inspection of the nares - Left - quiet respiration. Inspection of the nares - Right - quiet respiration. Mouth and Throat Lips - Upper Lip - no fissures observed, no pallor noted. Lower Lip - no fissures observed, no pallor noted. Nasopharynx - no discharge present. Oral Cavity/Oropharynx - Tongue - no dryness observed. Oral Mucosa - no cyanosis observed. Hypopharynx - no evidence of airway distress observed.  Chest and Lung Exam Inspection Movements - Normal and Symmetrical. Accessory muscles - No use of accessory muscles in breathing. Palpation Palpation of the chest reveals - Non-tender. Auscultation Breath sounds - Normal and Clear.  Cardiovascular Auscultation Rhythm - Regular. Murmurs & Other Heart Sounds - Auscultation of the heart reveals - No Murmurs and No Systolic Clicks.  Abdomen Inspection Inspection of the abdomen reveals - No Visible peristalsis and No Abnormal pulsations. Umbilicus - No Bleeding, No Urine drainage. Palpation/Percussion Palpation and Percussion of the abdomen reveal - Soft, Non Tender, No Rebound tenderness, No Rigidity (guarding) and No Cutaneous hyperesthesia. Note: Abdomen soft. Not severely distended. No diastasis recti. Supraumbilical  piercing clean. Low midline incision. No umbilical or other anterior abdominal wall hernias  Female Genitourinary Sexual Maturity Tanner 5 - Adult hair pattern. Note: No vaginal bleeding nor discharge  Rectal Note: Left lateral smooth perianal nodule 15 x 10 mm. Not verruca's her ulcerated necessarily. Within a centimeters anal verge.  Perianal skin clean with good hygiene. No pruritis ani. No pilonidal disease. No fissure. No abscess/fistula. Normal sphincter tone. No external hemorrhoids. No  condyloma warts. Tolerates digital and anoscopic rectal exam. No rectal masses. Hemorrhoidal piles grade 1-2. Exam done with assistance of female Medical Assistant in the room.  Peripheral Vascular Upper Extremity Inspection - Left - No Cyanotic nailbeds - Left, Not Ischemic. Inspection - Right - No Cyanotic nailbeds - Right, Not Ischemic.  Neurologic Neurologic evaluation reveals -normal attention span and ability to concentrate, able to name objects and repeat phrases. Appropriate fund of knowledge , normal sensation and normal coordination. Mental Status Affect - not angry, not paranoid. Cranial Nerves-Normal Bilaterally. Gait-Normal.  Neuropsychiatric Mental status exam performed with findings of-able to articulate well with normal speech/language, rate, volume and coherence, thought content normal with ability to perform basic computations and apply abstract reasoning and no evidence of hallucinations, delusions, obsessions or homicidal/suicidal ideation.  Musculoskeletal Global Assessment Spine, Ribs and Pelvis - no instability, subluxation or laxity. Right Upper Extremity - no instability, subluxation or laxity.  Lymphatic Head & Neck  General Head & Neck Lymphatics: Bilateral - Description - No Localized lymphadenopathy. Axillary  General Axillary Region: Bilateral - Description - No Localized lymphadenopathy. Femoral & Inguinal  Generalized Femoral & Inguinal  Lymphatics: Left - Description - No Localized lymphadenopathy. Right - Description - No Localized lymphadenopathy.    Assessment & Plan Adin Hector MD; 10/05/2019 9:21 AM)  PERIANAL MASS (K62.89) Impression: Perianal cystic mass. Been there for several years. Not particularly large. Does not give a story for a fistula or an abscess. Does not look like a hemorrhoid nor a wart.  Think it would make sense to remove this with outpatient surgery. Examination under anesthesia. Consider injecting with methylene blue to make sure there is no fistula there.  She recently has semiretired is doing a lot of traveling. I cautioned against trying to do any traveling within the first few weeks after this and she will need time to recover from pain or discomfort. Ideally wait 4-6 weeks. This is been there for several years and has not rapidly changing, so can wait a few months until more convenient time.  Current Plans ANOSCOPY, DIAGNOSTIC [id number]) You are being scheduled for surgery- Our schedulers will call you.  You should hear from our office's scheduling department within 5 working days about the location, date, and time of surgery. We try to make accommodations for patient's preferences in scheduling surgery, but sometimes the OR schedule or the surgeon's schedule prevents [place] from making those accommodations.  If you have not heard from our office [phone]) in 5 working days, call the office and ask for your surgeon's nurse.  If you have other questions about your diagnosis, plan, or surgery, call the office and ask for your surgeon's nurse.   PROLAPSED INTERNAL HEMORRHOIDS, GRADE 2 (K64.1)  Current Plans Pt Education - [facility] Hemorrhoids (Elisheba Mcdonnell): discussed with patient and provided information.  ENCOUNTER FOR PREOPERATIVE EXAMINATION FOR GENERAL SURGICAL PROCEDURE [id number])  Current Plans The anatomy & physiology of the anorectal region was discussed. The pathophysiology of  hemorrhoids and differential diagnosis was discussed. Natural history risks without surgery was discussed. I stressed the importance of a bowel regimen to have daily soft bowel movements to minimize progression of disease. Interventions such as sclerotherapy & banding were discussed.  The patient's symptoms are not adequately controlled by medicines and other non-operative treatments. I feel the risks & problems of no surgery outweigh the operative risks; therefore, I recommended surgery to treat the hemorrhoids by ligation, pexy, and possible resection.  Risks such as bleeding, infection, urinary difficulties,  need for further treatment, heart attack, death, and other risks were discussed. I noted a good likelihood this will help address the problem. Goals of post-operative recovery were discussed as well. Possibility that this will not correct all symptoms was explained. Post-operative pain, bleeding, constipation, and other problems after surgery were discussed. We will work to minimize complications. Educational handouts further explaining the pathology, treatment options, and bowel regimen were given as well. Questions were answered. The patient expresses understanding & wishes to proceed with surgery.  Pt Education - [facility] Rectal Prep for Anorectal outpatient/office surgery: discussed with patient and provided information. Pt Education - [facility] Rectal Surgery HCI (Ranell Skibinski): discussed with patient and provided information. Pt Education - [facility] Good Bowel Health (Leannah Guse)  Adin Hector, MD, FACS, MASCRS Gastrointestinal and Minimally Invasive Surgery  [facility] [address] [place], [place] [address] [phone] Main / Paging [phone] Fax

## 2019-10-05 NOTE — H&P (Signed)
Reynold Bowen Documented: 10/05/2019 8:52 AM Location: [facility] Patient #: [id number] DOB: 1966-04-28 Married / Language: Cleophus Molt / Race: White Female  History of Present Illness Adin Hector MD; 10/05/2019 10:20 AM) The patient is a 53 year old female who presents with anal lesions. Note for "Anal lesions": ` ` ` Patient sent for surgical consultation at the request of Dr. [facility] [place] Cellar, [facility]  Chief Complaint: Perianal lesion. ` ` The patient is a healthy woman his noticeable lump near her anus for the past few years. Presumed to be an external hemorrhoid. Felt that on a vacation many years ago with an episode of bleeding. No severe pain. Had a normal: Guarded age 47.Marland Kitchen Somewhat irritating at times. Concern for slightly larger. Sent to see gastroenterology. Did not seem like a classic hemorrhoid. Colonoscopy recommended. Hyperplastic polyp removed. Some nonspecific colitis mildly noted in right colon. Ileum clear. Pathology did not feel it was classic for inflammatory bowel disease. Favored NSAID or infectious related. Patient does note that she had a staging by a stingray and was on antibiotics and steroids for a few weeks this fall. Not really on any nonsteroidals. No obvious fissure or fistula. Surgical consultation requested.  She does not smoke. Not diabetic. No blood thinners. No sleep apnea. Otherwise rather healthy. No personal nor family history of GI/colon cancer, inflammatory bowel disease, irritable bowel syndrome, allergy such as Celiac Sprue, dietary/dairy problems, colitis, ulcers nor gastritis. No recent sick contacts/gastroenteritis. No travel outside the country. No changes in diet. No dysphagia to solids or liquids. No significant heartburn or reflux. No hematochezia, hematemesis, coffee ground emesis. No evidence of prior gastric/peptic ulceration.  (Review of systems as stated in this history (HPI) or in the  review of systems. Otherwise all other 12 point ROS are negative) ` ` `   Past Surgical History Sabino Gasser, [place]; 10/05/2019 8:52 AM) Cesarean Section - 1 Colon Polyp Removal - Colonoscopy  Diagnostic Studies History Sabino Gasser, CMA; 10/05/2019 8:52 AM) Colonoscopy within last year Mammogram within last year Pap Smear 1-5 years ago  Allergies Sabino Gasser, [place]; 10/05/2019 8:53 AM) Erythromycin Derivatives Morphine Derivatives Penicillins Allergies Reconciled  Medication History Sabino Gasser, CMA; 10/05/2019 8:54 AM) Lisinopril (10MG  Tablet, Oral) Active. Synthroid (50MCG Tablet, Oral) Active. Medications Reconciled  Pregnancy / Birth History Sabino Gasser, [place]; 10/05/2019 8:52 AM) Age at menarche 38 years. Gravida 1 Maternal age 59-20 Para 1 Regular periods  Other Problems Sabino Gasser, CMA; 10/05/2019 8:52 AM) High blood pressure Oophorectomy Right. Thyroid Disease     Review of Systems Sabino Gasser CMA; 10/05/2019 8:52 AM) General Not Present- Appetite Loss, Chills, Fatigue, Fever, Night Sweats, Weight Gain and Weight Loss. Skin Not Present- Change in Wart/Mole, Dryness, Hives, Jaundice, New Lesions, Non-Healing Wounds, Rash and Ulcer. HEENT Not Present- Earache, Hearing Loss, Hoarseness, Nose Bleed, Oral Ulcers, Ringing in the Ears, Seasonal Allergies, Sinus Pain, Sore Throat, Visual Disturbances, Wears glasses/contact lenses and Yellow Eyes. Respiratory Not Present- Bloody sputum, Chronic Cough, Difficulty Breathing, Snoring and Wheezing. Breast Not Present- Breast Mass, Breast Pain, Nipple Discharge and Skin Changes. Cardiovascular Not Present- Chest Pain, Difficulty Breathing Lying Down, Leg Cramps, Palpitations, Rapid Heart Rate, Shortness of Breath and Swelling of Extremities. Gastrointestinal Present- Bloating. Not Present- Abdominal Pain, Bloody Stool, Change in Bowel Habits, Chronic diarrhea, Constipation, Difficulty Swallowing,  Excessive gas, Gets full quickly at meals, Hemorrhoids, Indigestion, Nausea, Rectal Pain and Vomiting. Female Genitourinary Not Present- Frequency, Nocturia, Painful Urination, Pelvic Pain and Urgency. Musculoskeletal Not Present-  Back Pain, Joint Pain, Joint Stiffness, Muscle Pain, Muscle Weakness and Swelling of Extremities. Neurological Not Present- Decreased Memory, Fainting, Headaches, Numbness, Seizures, Tingling, Tremor, Trouble walking and Weakness. Psychiatric Not Present- Anxiety, Bipolar, Change in Sleep Pattern, Depression, Fearful and Frequent crying. Endocrine Not Present- Cold Intolerance, Excessive Hunger, Hair Changes, Heat Intolerance, Hot flashes and New Diabetes. Hematology Not Present- Blood Thinners, Easy Bruising, Excessive bleeding, Gland problems, HIV and Persistent Infections.  Vitals Sabino Gasser CMA; 10/05/2019 8:55 AM) 10/05/2019 8:54 AM Weight: 152.6 lb Height: 63in Body Surface Area: 1.72 m Body Mass Index: 27.03 kg/m  Temp.: 98.75F(Tympanic)  Pulse: 115 (Regular)  BP: 110/72 (Sitting, Left Arm, Standard)        Physical Exam Adin Hector MD; 10/05/2019 9:08 AM)  General Mental Status-Alert. General Appearance-Not in acute distress, Not Sickly. Orientation-Oriented X3. Hydration-Well hydrated. Voice-Normal.  Integumentary Global Assessment Upon inspection and palpation of skin surfaces of the - Axillae: non-tender, no inflammation or ulceration, no drainage. and Distribution of scalp and body hair is normal. General Characteristics Temperature - normal warmth is noted.  Head and Neck Head-normocephalic, atraumatic with no lesions or palpable masses. Face Global Assessment - atraumatic, no absence of expression. Neck Global Assessment - no abnormal movements, no bruit auscultated on the right, no bruit auscultated on the left, no decreased range of motion, non-tender. Trachea-midline. Thyroid Gland  Characteristics - non-tender.  Eye Eyeball - Left-Extraocular movements intact, No Nystagmus - Left. Eyeball - Right-Extraocular movements intact, No Nystagmus - Right. Cornea - Left-No Hazy - Left. Cornea - Right-No Hazy - Right. Sclera/Conjunctiva - Left-No scleral icterus, No Discharge - Left. Sclera/Conjunctiva - Right-No scleral icterus, No Discharge - Right. Pupil - Left-Direct reaction to light normal. Pupil - Right-Direct reaction to light normal.  ENMT Ears Pinna - Left - no drainage observed, no generalized tenderness observed. Pinna - Right - no drainage observed, no generalized tenderness observed. Nose and Sinuses External Inspection of the Nose - no destructive lesion observed. Inspection of the nares - Left - quiet respiration. Inspection of the nares - Right - quiet respiration. Mouth and Throat Lips - Upper Lip - no fissures observed, no pallor noted. Lower Lip - no fissures observed, no pallor noted. Nasopharynx - no discharge present. Oral Cavity/Oropharynx - Tongue - no dryness observed. Oral Mucosa - no cyanosis observed. Hypopharynx - no evidence of airway distress observed.  Chest and Lung Exam Inspection Movements - Normal and Symmetrical. Accessory muscles - No use of accessory muscles in breathing. Palpation Palpation of the chest reveals - Non-tender. Auscultation Breath sounds - Normal and Clear.  Cardiovascular Auscultation Rhythm - Regular. Murmurs & Other Heart Sounds - Auscultation of the heart reveals - No Murmurs and No Systolic Clicks.  Abdomen Inspection Inspection of the abdomen reveals - No Visible peristalsis and No Abnormal pulsations. Umbilicus - No Bleeding, No Urine drainage. Palpation/Percussion Palpation and Percussion of the abdomen reveal - Soft, Non Tender, No Rebound tenderness, No Rigidity (guarding) and No Cutaneous hyperesthesia. Note: Abdomen soft. Not severely distended. No diastasis recti. Supraumbilical  piercing clean. Low midline incision. No umbilical or other anterior abdominal wall hernias  Female Genitourinary Sexual Maturity Tanner 5 - Adult hair pattern. Note: No vaginal bleeding nor discharge  Rectal Note: Left lateral smooth perianal nodule 15 x 10 mm. Not verruca's her ulcerated necessarily. Within a centimeters anal verge.  Perianal skin clean with good hygiene. No pruritis ani. No pilonidal disease. No fissure. No abscess/fistula. Normal sphincter tone. No external hemorrhoids. No  condyloma warts. Tolerates digital and anoscopic rectal exam. No rectal masses. Hemorrhoidal piles grade 1-2. Exam done with assistance of female Medical Assistant in the room.  Peripheral Vascular Upper Extremity Inspection - Left - No Cyanotic nailbeds - Left, Not Ischemic. Inspection - Right - No Cyanotic nailbeds - Right, Not Ischemic.  Neurologic Neurologic evaluation reveals -normal attention span and ability to concentrate, able to name objects and repeat phrases. Appropriate fund of knowledge , normal sensation and normal coordination. Mental Status Affect - not angry, not paranoid. Cranial Nerves-Normal Bilaterally. Gait-Normal.  Neuropsychiatric Mental status exam performed with findings of-able to articulate well with normal speech/language, rate, volume and coherence, thought content normal with ability to perform basic computations and apply abstract reasoning and no evidence of hallucinations, delusions, obsessions or homicidal/suicidal ideation.  Musculoskeletal Global Assessment Spine, Ribs and Pelvis - no instability, subluxation or laxity. Right Upper Extremity - no instability, subluxation or laxity.  Lymphatic Head & Neck  General Head & Neck Lymphatics: Bilateral - Description - No Localized lymphadenopathy. Axillary  General Axillary Region: Bilateral - Description - No Localized lymphadenopathy. Femoral & Inguinal  Generalized Femoral & Inguinal  Lymphatics: Left - Description - No Localized lymphadenopathy. Right - Description - No Localized lymphadenopathy.    Assessment & Plan Adin Hector MD; 10/05/2019 10:20 AM)  PERIANAL MASS (K62.89) Impression: Perianal cystic mass. Been there for several years. Not particularly large. Does not give a story for a fistula or an abscess. Does not look like a hemorrhoid nor a wart.  Think it would make sense to remove this with outpatient surgery. Examination under anesthesia. Consider injecting with methylene blue to make sure there is no fistula there.  She recently has semiretired is doing a lot of traveling. I cautioned against trying to do any traveling within the first few weeks after this and she will need time to recover from pain or discomfort. Ideally wait 4-6 weeks. This is been there for several years and has not rapidly changing, so can wait a few months until more convenient time.  Current Plans ANOSCOPY, DIAGNOSTIC [id number]) You are being scheduled for surgery- Our schedulers will call you.  You should hear from our office's scheduling department within 5 working days about the location, date, and time of surgery. We try to make accommodations for patient's preferences in scheduling surgery, but sometimes the OR schedule or the surgeon's schedule prevents [place] from making those accommodations.  If you have not heard from our office [phone]) in 5 working days, call the office and ask for your surgeon's nurse.  If you have other questions about your diagnosis, plan, or surgery, call the office and ask for your surgeon's nurse.   PROLAPSED INTERNAL HEMORRHOIDS, GRADE 2 (K64.1) Impression: Some mildly enlarged hemorrhoids especially right anterior. I offered hemorrhoidal ligation and pexy at the time of surgery. May need excision as well, but doubtful. She likes the idea of having any hemorrhoidal issues addressed at the time of surgery as well.  Current Plans Pt Education  - [facility] Hemorrhoids (Lugene Hitt): discussed with patient and provided information.  ENCOUNTER FOR PREOPERATIVE EXAMINATION FOR GENERAL SURGICAL PROCEDURE [id number])  Current Plans The anatomy & physiology of the anorectal region was discussed. The pathophysiology of hemorrhoids and differential diagnosis was discussed. Natural history risks without surgery was discussed. I stressed the importance of a bowel regimen to have daily soft bowel movements to minimize progression of disease. Interventions such as sclerotherapy & banding were discussed.  The patient's symptoms are not  adequately controlled by medicines and other non-operative treatments. I feel the risks & problems of no surgery outweigh the operative risks; therefore, I recommended surgery to treat the hemorrhoids by ligation, pexy, and possible resection.  Risks such as bleeding, infection, urinary difficulties, need for further treatment, heart attack, death, and other risks were discussed. I noted a good likelihood this will help address the problem. Goals of post-operative recovery were discussed as well. Possibility that this will not correct all symptoms was explained. Post-operative pain, bleeding, constipation, and other problems after surgery were discussed. We will work to minimize complications. Educational handouts further explaining the pathology, treatment options, and bowel regimen were given as well. Questions were answered. The patient expresses understanding & wishes to proceed with surgery.  Pt Education - CCS Rectal Prep for Anorectal outpatient/office surgery: discussed with patient and provided information. Pt Education - [facility] Rectal Surgery HCI (Ming Kunka): discussed with patient and provided information. Pt Education - CCS Good Bowel Health (Dandrea Medders)  Adin Hector, MD, FACS, MASCRS Gastrointestinal and Minimally Invasive Surgery  [facility] [address], [place], [place]  [id number] [phone] Main / Paging [phone] Fax

## 2019-10-20 ENCOUNTER — Ambulatory Visit
Admission: EM | Admit: 2019-10-20 | Discharge: 2019-10-20 | Disposition: A | Discharge status: Home / Self Care | Payer: Commercial Managed Care - PPO | Attending: Urgent Care | Admitting: Urgent Care

## 2019-10-20 ENCOUNTER — Other Ambulatory Visit: Payer: Self-pay

## 2019-10-20 NOTE — ED Provider Notes (Signed)
Patient left that 21 minutes of being in the clinic.  I did not see the patient.  My understanding is after she was swabbed for COVID-19, she left because she did not want to be seen and only wanted the testing.   Jaynee Eagles, [place] 10/20/19 [phone]

## 2019-10-20 NOTE — ED Triage Notes (Signed)
Pt presents to UC stating she has had positive covid exposure. Pt states the only symptom she has is a headache. After being tested, pt did not want to be seen by provider.

## 2019-10-22 LAB — NOVEL CORONAVIRUS, NAA: SARS-CoV-2, NAA: NOT DETECTED

## 2019-10-23 ENCOUNTER — Encounter: Payer: Self-pay | Admitting: Physician Assistant

## 2019-10-23 MED ORDER — LISINOPRIL 10 MG PO TABS: 10.0000 mg | ORAL_TABLET | Freq: Every day | ORAL | 1 refills | Status: DC

## 2019-11-26 ENCOUNTER — Other Ambulatory Visit: Payer: Self-pay

## 2019-11-26 ENCOUNTER — Encounter: Payer: Self-pay | Admitting: Gastroenterology

## 2019-11-26 ENCOUNTER — Ambulatory Visit (INDEPENDENT_AMBULATORY_CARE_PROVIDER_SITE_OTHER): Payer: Commercial Managed Care - PPO | Admitting: Gastroenterology

## 2019-11-26 VITALS — BP 128/70 | HR 78 | Temp 97.6°F | Ht 63.0 in | Wt 155.2 lb

## 2019-11-26 DIAGNOSIS — K6289 Other specified diseases of anus and rectum: Secondary | ICD-10-CM

## 2019-11-26 DIAGNOSIS — K219 Gastro-esophageal reflux disease without esophagitis: Secondary | ICD-10-CM

## 2019-11-26 DIAGNOSIS — R933 Abnormal findings on diagnostic imaging of other parts of digestive tract: Secondary | ICD-10-CM | POA: Diagnosis not present

## 2019-11-26 MED ORDER — FAMOTIDINE 20 MG PO TABS: 20.0000 mg | ORAL_TABLET | Freq: Every day | ORAL | 3 refills | Status: DC

## 2019-11-26 NOTE — Patient Instructions (Addendum)
If you are age 52 or older, your body mass index should be between 23-30. Your Body mass index is 27.49 kg/m. If this is out of the aforementioned range listed, please consider follow up with your Primary Care Provider.  If you are age 50 or younger, your body mass index should be between 19-25. Your Body mass index is 27.49 kg/m. If this is out of the aformentioned range listed, please consider follow up with your Primary Care Provider.   We have sent the following medications to your pharmacy for you to pick up at your convenience: Pepcid 20mg : Take one to two tablets daily   Thank you for entrusting me with your care and for choosing [facility], Dr. [place] Cellar

## 2019-11-26 NOTE — Progress Notes (Signed)
HPI :  54 year old female here for a follow-up visit regarding abnormal colonoscopy, perianal lesion, and GERD.  I last saw Cassandra Warren for colonoscopy in November.  This was done to further evaluate perianal cystic lesion and perform Cassandra Warren screening.  She was found to have a suspected 1 cm cystic lesion in the left lateral perianal area, hard to palpation.  Unclear etiology, I referred Cassandra Warren to colorectal surgery.  She was seen by Dr. Johney Maine and has a scheduled surgery for removal of this in the next few weeks.  She otherwise was noted to have some patchy mild inflammation in the right colon.  Biopsies showed mildly active chronic nonspecific colitis.  Reviewed with pathology, this was not diagnostic for IBD, differential included infectious changes, medication changes such as NSAIDs, or early IBD.  The patient has had absolutely no problems with Cassandra Warren bowels.  She has no diarrhea or constipation.  No abdominal pains.  No rectal bleeding.  She generally feels well.  She does not use NSAIDs routinely.  She denies any family history of IBD.  She otherwise has periodic reflux that bothers Cassandra Warren, usually with specific trigger foods.  She takes Rolaids as needed, does not help too much.  She does not use anything else for Cassandra Warren reflux.  Prior workup: Colonoscopy 09/10/19 - The perianal exam findings include roughly 1cm cystic lesion in the left lateral area, hard to palpation. - The terminal ileum appeared normal. - Patchy mild inflammation characterized by granularity was found in the ascending colon and in the cecum. Biopsies were taken with a cold forceps for histology. - A few small-mouthed diverticula were found in the sigmoid colon. - A diminutive polyp was found in the recto-sigmoid colon. The polyp was sessile. The polyp was removed with a cold snare. Resection and retrieval were complete. - The exam was otherwise without abnormality.  Diagnosis 1. Surgical [P], cecum and right colon bx - MILDLY ACTIVE  CHRONIC NONSPECIFIC COLITIS. 2. Surgical [P], rectosigmoid, polyp - HYPERPLASTIC POLYP. Microscopic Comment 1. Features of chronicity are mild and include patchy lamina propria lymphoplasmacytosis and mild architectural changes. The findings are not diagnostic of inflammatory bowel disease. The differential diagnosis includes drug-effect (e.g. NSAIDs), infection and early inflammatory bowel disease. Radiologic correlation and patient follow up are suggested.   Past Medical History:  Diagnosis Date  . Anemia   . Hypertension   . Thyroid disease      Past Surgical History:  Procedure Laterality Date  . BREAST CYST ASPIRATION Left   . CESAREAN SECTION    . COLONOSCOPY  09/10/2019  . OOPHORECTOMY     left ovary only  . WISDOM TOOTH EXTRACTION     Family History  Problem Relation Age of Onset  . Diabetes Mother   . Heart disease Father   . Mental illness Brother   . Breast cancer Maternal Aunt    Social History   Tobacco Use  . Smoking status: Never Smoker  . Smokeless tobacco: Never Used  Substance Use Topics  . Alcohol use: Yes  . Drug use: No   Current Outpatient Medications  Medication Sig Dispense Refill  . lisinopril (ZESTRIL) 10 MG tablet Take 1 tablet (10 mg total) by mouth daily. 90 tablet 1  . SYNTHROID 50 MCG tablet TAKE 1 TABLET IN THE MORNING ON AN EMPTY STOMACH. 30 tablet 0  . hydrocortisone (ANUSOL-HC) 25 MG suppository Place 1 suppository (25 mg total) rectally 2 (two) times daily. (Patient not taking: Reported on 09/10/2019) 12 suppository  0  . lidocaine (XYLOCAINE) 2 % jelly Apply to affected area as needed (Patient not taking: Reported on 09/10/2019) 30 mL 0   No current facility-administered medications for this visit.   Allergies  Allergen Reactions  . Erythromycin Other (See Comments)    Hands and feet tingling  . Morphine And Related Rash  . Penicillin G Palpitations     Review of Systems: All systems reviewed and negative except where  noted in HPI.     Physical Exam: BP 128/70   Pulse 78   Temp 97.6 F (36.4 C)   Ht 5\' 3"  (1.6 m)   Wt 155 lb 3.2 oz (70.4 kg)   BMI 27.49 kg/m  Constitutional: Pleasant,well-developed, female in no acute distress. HEENT: Normocephalic and atraumatic. Conjunctivae are normal. No scleral icterus. Lymphadenopathy: No cervical adenopathy noted. Neurological: Alert and oriented to person place and time. Psychiatric: Normal mood and affect. Behavior is normal.   ASSESSMENT AND PLAN: 54 y/o female here for reassessment of the following issues:  Abnormal colonoscopy - the patient had some very mild nonspecific inflammatory changes in the right side of Cassandra Warren colon on the last colonoscopy.  Path as above, nonspecific.  She has absolutely no abdominal pain, no diarrhea, no blood in Cassandra Warren stools.  I think it unlikely that she has Crohn's disease given Cassandra Warren course, unless she has very mild localized disease in Cassandra Warren right colon.  Unclear if this was medication side effect or resolving infectious etiology.  Counseled to monitor for now, if she develops diarrhea or abdominal pain moving forward she should contact me.  She is not due for another colonoscopy for 10  Years for screening purposes  Perianal cystic lesion - benign appearing lesion in the perianal area, scheduled for a follow-up with Dr. Johney Maine for surgical removal in the upcoming weeks.  GERD - mild intermittent reflux symptoms, often associated with trigger foods.  She is not currently taking anything for this.  Discussed options with Cassandra Warren.  Recommend trial of Pepcid 20 mg once to twice daily as needed, can try taking before she knows she is going to eat a meal with trigger foods.  She can follow-up as needed for this.  I spent 30 minutes of time, including independent review of results as outlined above, communicating results with the patient directly, face-to-face time with the patient, coordinating care, ordering studies and medications as  appropriate, and documenting this encounter.    Pratt Cellar, MD [facility]

## 2019-12-17 ENCOUNTER — Other Ambulatory Visit (HOSPITAL_COMMUNITY): Payer: Commercial Managed Care - PPO

## 2019-12-24 ENCOUNTER — Other Ambulatory Visit: Payer: Self-pay

## 2019-12-24 ENCOUNTER — Encounter (HOSPITAL_BASED_OUTPATIENT_CLINIC_OR_DEPARTMENT_OTHER): Payer: Self-pay | Admitting: Surgery

## 2019-12-24 NOTE — Progress Notes (Signed)
Spoke w/ via phone for pre-op interview---[facility] needs dos---- I stat 8, ekg  , urine poct           COVID test ------12-27-2019 Arrive at -------530 am 12-30-2019 NPO after ------midnight Medications to take morning of surgery -----synthroid, famotidine Diabetic medication -----n/a Patient Special Instructions ----- Pre-Op special Istructions ----- Patient verbalized understanding of instructions that were given at this phone interview. Patient denies shortness of breath, chest pain, fever, cough a this phone interview.

## 2019-12-27 ENCOUNTER — Other Ambulatory Visit: Payer: Self-pay

## 2019-12-27 ENCOUNTER — Other Ambulatory Visit (HOSPITAL_COMMUNITY)
Admission: RE | Admit: 2019-12-27 | Discharge: 2019-12-27 | Disposition: A | Discharge status: Home / Self Care | Payer: Commercial Managed Care - PPO | Source: Ambulatory Visit | Attending: Surgery | Admitting: Surgery

## 2019-12-27 DIAGNOSIS — Z01812 Encounter for preprocedural laboratory examination: Secondary | ICD-10-CM | POA: Diagnosis present

## 2019-12-27 DIAGNOSIS — U071 COVID-19: Secondary | ICD-10-CM | POA: Insufficient documentation

## 2019-12-27 LAB — SARS CORONAVIRUS 2 (TAT 6-24 HRS): SARS Coronavirus 2: POSITIVE — AB

## 2019-12-27 LAB — NOVEL CORONAVIRUS, NAA: SARS-CoV-2, NAA: POSITIVE

## 2019-12-28 NOTE — Progress Notes (Signed)
Pt covid test is positive dated 12-27-2019, which was done for surgery on 12-30-2019.  Called and spoke w/ Cassandra Warren , OR scheduler for Dr Johney Maine, stated she will let Dr Johney Maine know.

## 2019-12-29 ENCOUNTER — Ambulatory Visit: Payer: Commercial Managed Care - PPO | Attending: Internal Medicine

## 2019-12-29 ENCOUNTER — Other Ambulatory Visit: Payer: Self-pay

## 2019-12-29 ENCOUNTER — Other Ambulatory Visit: Payer: Commercial Managed Care - PPO

## 2019-12-29 DIAGNOSIS — Z20822 Contact with and (suspected) exposure to covid-19: Secondary | ICD-10-CM

## 2019-12-30 ENCOUNTER — Telehealth: Payer: Self-pay

## 2019-12-30 LAB — NOVEL CORONAVIRUS, NAA: SARS-CoV-2, NAA: NOT DETECTED

## 2019-12-30 NOTE — Telephone Encounter (Signed)
Pt. ret'd call.  Voiced concern of pre-op COVID test taken on 12/27/19, that was time- stamped at 7:17 AM, but the pt. stated she was not swabbed until approx. 2:00 PM; the test result showed "positive".  Reported she went to be retested on 12/29/19 @ 9:30 AM, and the test was "negative".  Pt. Questioned if the 1st test was her swab, since the time-stamp does not match, the time that she was swabbed.  And also since she tested negative 2 days later, she feels the test on 3/1 was not hers.  She also reported she contacted [facility], and was informed that they don't have any record of the test done on 3/1, but did have record of the test done on 3/3.  Advised pt. will do further investigation into this, and will return call to her.  Agreed with plan.

## 2019-12-30 NOTE — Telephone Encounter (Signed)
Was advised by Agent that pt. Called to discuss recent COVID test that showed pt. Was positive on one date, and negative on another date.   Attempted to return call to pt.  Left vm to return call to [phone] to discuss COVID test results.

## 2019-12-31 ENCOUNTER — Ambulatory Visit
Admission: EM | Admit: 2019-12-31 | Discharge: 2019-12-31 | Disposition: A | Discharge status: Home / Self Care | Payer: Commercial Managed Care - PPO | Attending: Emergency Medicine | Admitting: Emergency Medicine

## 2019-12-31 ENCOUNTER — Other Ambulatory Visit: Payer: Self-pay

## 2019-12-31 DIAGNOSIS — Z20822 Contact with and (suspected) exposure to covid-19: Secondary | ICD-10-CM

## 2019-12-31 NOTE — Telephone Encounter (Signed)
Phone call to patient to follow up from further investigation re: recent positive COVID test that was positive on 3/1.  Advised pt. That, per the Site Lead at the [facility]. [employer] underwriter, her specimen was handled and labeled separate from any other pt., and then was transported to [employer], for processing.  Advised pt. that the time stamp on the specimen was most likely related to error in keying in the time; "0717" was stamped on specimen arrival time, and likely should have been 1717.  Advised pt. that due to positive test result on 3/1, she should quarantine x 10 days from positive test date, or from onset of symptoms.  The pt. stated she is not having any symptoms.  Encouraged to remain in quarantine until 01/06/20.  Advised she could retest at end of her quarantine for her own knowledge, but that a positive reading could continue to show up for about 60-90 days.  Pt. Verb. Understanding.  Did question if she could receive the more invasive nasopharyngeal swab done when she retests, after her quarantine ends. Advised that this may require a physician order.  Advised will find out for certain and notify the pt. Re: need of order.  Agreed with plan.

## 2019-12-31 NOTE — ED Provider Notes (Signed)
[facility]   [id number] 12/31/19 Arrival Time: [id number]   CC: COVID test  SUBJECTIVE: History from: patient.  Cassandra Warren is a 54 y.o. female who presents for COVID testing.  Denies sick exposure to COVID, flu or strep.  Denies recent travel.  Denies aggravating or alleviating symptoms.  Denies previous COVID infection.   Denies fever, chills, fatigue, nasal congestion, rhinorrhea, sore throat, cough, SOB, wheezing, chest pain, nausea, vomiting, changes in bowel or bladder habits.    States she had a COVID test on Monday that was positive.  Retested Tuesday and was negative.  Here today for confirmatory testing.    ROS: As per HPI.  All other pertinent ROS negative.     Past Medical History:  Diagnosis Date  . Anemia   . Arthritis    back  . GERD (gastroesophageal reflux disease)   . Hypertension   . Mass of perirectal soft tissue   . Thyroid disease    Past Surgical History:  Procedure Laterality Date  . BREAST CYST ASPIRATION Left   . CESAREAN SECTION    . COLONOSCOPY  09/10/2019  . OOPHORECTOMY     left ovary only  . WISDOM TOOTH EXTRACTION     Allergies  Allergen Reactions  . Erythromycin Other (See Comments)    Hands and feet tingling  . Morphine And Related Rash  . Penicillin G Palpitations   No current facility-administered medications on file prior to encounter.   Current Outpatient Medications on File Prior to Encounter  Medication Sig Dispense Refill  . famotidine (PEPCID) 20 MG tablet Take 20 mg by mouth as needed for heartburn or indigestion.    Marland Kitchen lisinopril (ZESTRIL) 10 MG tablet Take 1 tablet (10 mg total) by mouth daily. 90 tablet 1  . SYNTHROID 50 MCG tablet TAKE 1 TABLET IN THE MORNING ON AN EMPTY STOMACH. 30 tablet 0   Social History   Socioeconomic History  . Marital status: Married    Spouse name: Not on file  . Number of children: Not on file  . Years of education: Not on file  . Highest education level: Not on file    Occupational History  . Not on file  Tobacco Use  . Smoking status: Never Smoker  . Smokeless tobacco: Never Used  Substance and Sexual Activity  . Alcohol use: Yes    Comment: occ  . Drug use: No  . Sexual activity: Yes    Birth control/protection: None  Other Topics Concern  . Not on file  Social History Narrative  . Not on file   Social Determinants of Health   Financial Resource Strain:   . Difficulty of Paying Living Expenses: Not on file  Food Insecurity:   . Worried About Charity fundraiser in the Last Year: Not on file  . Ran Out of Food in the Last Year: Not on file  Transportation Needs:   . Lack of Transportation (Medical): Not on file  . Lack of Transportation (Non-Medical): Not on file  Physical Activity:   . Days of Exercise per Week: Not on file  . Minutes of Exercise per Session: Not on file  Stress:   . Feeling of Stress : Not on file  Social Connections:   . Frequency of Communication with Friends and Family: Not on file  . Frequency of Social Gatherings with Friends and Family: Not on file  . Attends Religious Services: Not on file  . Active Member of Clubs or Organizations:  Not on file  . Attends Archivist Meetings: Not on file  . Marital Status: Not on file  Intimate Partner Violence:   . Fear of Current or Ex-Partner: Not on file  . Emotionally Abused: Not on file  . Physically Abused: Not on file  . Sexually Abused: Not on file   Family History  Problem Relation Age of Onset  . Diabetes Mother   . Heart disease Father   . Mental illness Brother   . Breast cancer Maternal Aunt     OBJECTIVE:  Vitals:   12/31/19 1107  BP: 126/79  Pulse: 72  Resp: 16  Temp: 98 F (36.7 C)  TempSrc: Oral  SpO2: 98%     General appearance: alert; well-appearing, nontoxic; speaking in full sentences and tolerating own secretions HEENT: NCAT; Ears: EACs clear, TMs pearly gray; Eyes: PERRL.  EOM grossly intact.Nose: nares patent without  rhinorrhea, Throat: oropharynx clear, tonsils non erythematous or enlarged, uvula midline  Neck: supple without LAD Lungs: unlabored respirations, symmetrical air entry; cough: absent; no respiratory distress; CTAB Heart: regular rate and rhythm.  Skin: warm and dry Psychological: alert and cooperative; normal mood and affect  ASSESSMENT & PLAN:  1. Encounter for laboratory testing for COVID-19 virus   2. Suspected COVID-19 virus infection     COVID testing ordered.  It will take between 2-5 days for test results.  Someone will contact you regarding abnormal results.    In the meantime: You should remain isolated in your home for 10 days from symptom onset AND greater than 72 hours after symptoms resolution (absence of fever without the use of fever-reducing medication and improvement in respiratory symptoms), whichever is longer OR 14 days from exposure Get plenty of rest and push fluids zyrtec for nasal congestion, runny nose, and/or sore throat flonase for nasal congestion and runny nose Use medications daily for symptom relief Use OTC medications like ibuprofen or tylenol as needed fever or pain Call or go to the ED if you have any new or worsening symptoms such as fever, cough, shortness of breath, chest tightness, chest pain, turning blue, changes in mental status, etc...   Reviewed expectations re: course of current medical issues. Questions answered. Outlined signs and symptoms indicating need for more acute intervention. Patient verbalized understanding. After Visit Summary given.         Lestine Box, PA-C 12/31/19 1142

## 2019-12-31 NOTE — Discharge Instructions (Signed)
COVID testing ordered.  It will take between 2-5 days for test results.  Someone will contact you regarding abnormal results.    In the meantime: You should remain isolated in your home for 10 days from symptom onset AND greater than 72 hours after symptoms resolution (absence of fever without the use of fever-reducing medication and improvement in respiratory symptoms), whichever is longer OR 14 days from exposure Get plenty of rest and push fluids zyrtec for nasal congestion, runny nose, and/or sore throat flonase for nasal congestion and runny nose Use medications daily for symptom relief Use OTC medications like ibuprofen or tylenol as needed fever or pain Call or go to the ED if you have any new or worsening symptoms such as fever, cough, shortness of breath, chest tightness, chest pain, turning blue, changes in mental status, etc..Marland Kitchen

## 2019-12-31 NOTE — ED Triage Notes (Addendum)
Pt presents to UC for covid test. Pt states she has had one positive rapid test done 4 days ago for procedure, but it had the wrong time stamp on it. She received another covid test yesterday and it was negative. Pt wants another one to rule out if she is positive or negative. Pt denies symptoms at time of first test or at this time.

## 2020-01-02 LAB — NOVEL CORONAVIRUS, NAA: SARS-CoV-2, NAA: NOT DETECTED

## 2020-01-03 NOTE — Telephone Encounter (Signed)
done

## 2020-01-10 ENCOUNTER — Encounter: Payer: Self-pay | Admitting: Physician Assistant

## 2020-01-11 ENCOUNTER — Encounter: Payer: Self-pay | Admitting: Physician Assistant

## 2020-01-13 ENCOUNTER — Other Ambulatory Visit: Payer: Self-pay

## 2020-01-13 ENCOUNTER — Encounter (HOSPITAL_BASED_OUTPATIENT_CLINIC_OR_DEPARTMENT_OTHER): Payer: Self-pay | Admitting: Surgery

## 2020-01-13 NOTE — Progress Notes (Signed)
Spoke with: Santiago Glad NPO:  No food after midnight/Clear liquids until 8:00 AM DOS Arrival time: 9:00 AM Lab needs dos---- istat 8, ekg            Lab results------N/A COVID test ------No retest positive result 12/2019 Medications to take morning of surgery: Synthroid Pre op orders in epic Yes Diabetic medication -----N/A Patient Special Instructions -----N/A Pre-Op special Istructions -----N/A Ride home: Nicole Kindred (husband) [phone]  Patient verbalized understanding of instructions that were given at this phone interview. Patient denies shortness of breath, chest pain, fever, cough a this phone interview.

## 2020-01-18 ENCOUNTER — Ambulatory Visit: Payer: Commercial Managed Care - PPO

## 2020-01-21 ENCOUNTER — Encounter (HOSPITAL_BASED_OUTPATIENT_CLINIC_OR_DEPARTMENT_OTHER)
Admission: RE | Disposition: A | Discharge status: Home / Self Care | Payer: Self-pay | Source: Home / Self Care | Attending: Surgery

## 2020-01-21 ENCOUNTER — Ambulatory Visit (HOSPITAL_BASED_OUTPATIENT_CLINIC_OR_DEPARTMENT_OTHER): Payer: Commercial Managed Care - PPO | Admitting: Anesthesiology

## 2020-01-21 ENCOUNTER — Encounter (HOSPITAL_BASED_OUTPATIENT_CLINIC_OR_DEPARTMENT_OTHER): Payer: Self-pay | Admitting: Surgery

## 2020-01-21 ENCOUNTER — Other Ambulatory Visit: Payer: Self-pay

## 2020-01-21 ENCOUNTER — Ambulatory Visit (HOSPITAL_BASED_OUTPATIENT_CLINIC_OR_DEPARTMENT_OTHER)
Admission: RE | Admit: 2020-01-21 | Discharge: 2020-01-21 | Disposition: A | Discharge status: Home / Self Care | Payer: Commercial Managed Care - PPO | Attending: Surgery | Admitting: Surgery

## 2020-01-21 DIAGNOSIS — Z8616 Personal history of COVID-19: Secondary | ICD-10-CM | POA: Diagnosis not present

## 2020-01-21 DIAGNOSIS — K219 Gastro-esophageal reflux disease without esophagitis: Secondary | ICD-10-CM | POA: Diagnosis not present

## 2020-01-21 DIAGNOSIS — Z79899 Other long term (current) drug therapy: Secondary | ICD-10-CM | POA: Diagnosis not present

## 2020-01-21 DIAGNOSIS — K641 Second degree hemorrhoids: Secondary | ICD-10-CM | POA: Diagnosis not present

## 2020-01-21 DIAGNOSIS — E039 Hypothyroidism, unspecified: Secondary | ICD-10-CM | POA: Diagnosis not present

## 2020-01-21 DIAGNOSIS — I10 Essential (primary) hypertension: Secondary | ICD-10-CM | POA: Insufficient documentation

## 2020-01-21 DIAGNOSIS — K642 Third degree hemorrhoids: Secondary | ICD-10-CM

## 2020-01-21 DIAGNOSIS — Z79890 Hormone replacement therapy (postmenopausal): Secondary | ICD-10-CM | POA: Insufficient documentation

## 2020-01-21 DIAGNOSIS — K6289 Other specified diseases of anus and rectum: Secondary | ICD-10-CM | POA: Insufficient documentation

## 2020-01-21 DIAGNOSIS — D239 Other benign neoplasm of skin, unspecified: Secondary | ICD-10-CM | POA: Insufficient documentation

## 2020-01-21 DIAGNOSIS — I998 Other disorder of circulatory system: Secondary | ICD-10-CM | POA: Diagnosis not present

## 2020-01-21 DIAGNOSIS — N9089 Other specified noninflammatory disorders of vulva and perineum: Secondary | ICD-10-CM

## 2020-01-21 HISTORY — PX: MASS EXCISION: SHX2000

## 2020-01-21 HISTORY — PX: RECTAL EXAM UNDER ANESTHESIA: SHX6399

## 2020-01-21 HISTORY — DX: Disorder of kidney and ureter, unspecified: N28.9

## 2020-01-21 HISTORY — DX: Gastro-esophageal reflux disease without esophagitis: K21.9

## 2020-01-21 HISTORY — DX: Unspecified osteoarthritis, unspecified site: M19.90

## 2020-01-21 HISTORY — DX: Other specified diseases of anus and rectum: K62.89

## 2020-01-21 HISTORY — DX: Personal history of COVID-19: Z86.16

## 2020-01-21 LAB — POCT I-STAT, CHEM 8
BUN: 13 mg/dL (ref 6–20)
Calcium, Ion: 1.18 mmol/L (ref 1.15–1.40)
Chloride: 109 mmol/L (ref 98–111)
Creatinine, Ser: 0.9 mg/dL (ref 0.44–1.00)
Glucose, Bld: 86 mg/dL (ref 70–99)
HCT: 44 % (ref 36.0–46.0)
Hemoglobin: 15 g/dL (ref 12.0–15.0)
Potassium: 4.2 mmol/L (ref 3.5–5.1)
Sodium: 141 mmol/L (ref 135–145)
TCO2: 24 mmol/L (ref 22–32)

## 2020-01-21 SURGERY — EXAM UNDER ANESTHESIA, RECTUM
Anesthesia: General | Site: Rectum

## 2020-01-21 MED ORDER — CHLORHEXIDINE GLUCONATE CLOTH 2 % EX PADS
6.0000 | MEDICATED_PAD | Freq: Once | CUTANEOUS | Status: DC
  Filled 2020-01-21: qty 6

## 2020-01-21 MED ORDER — MIDAZOLAM HCL 2 MG/2ML IJ SOLN
INTRAMUSCULAR | Status: AC
  Filled 2020-01-21: qty 2

## 2020-01-21 MED ORDER — BUPIVACAINE LIPOSOME 1.3 % IJ SUSP
20.0000 mL | Freq: Once | INTRAMUSCULAR | Status: DC
  Filled 2020-01-21: qty 20

## 2020-01-21 MED ORDER — CELECOXIB 200 MG PO CAPS
ORAL_CAPSULE | ORAL | Status: AC
  Filled 2020-01-21: qty 1

## 2020-01-21 MED ORDER — PROPOFOL 10 MG/ML IV BOLUS
INTRAVENOUS | Status: AC
  Filled 2020-01-21: qty 20

## 2020-01-21 MED ORDER — DEXAMETHASONE SODIUM PHOSPHATE 10 MG/ML IJ SOLN
INTRAMUSCULAR | Status: AC
  Filled 2020-01-21: qty 1

## 2020-01-21 MED ORDER — GENTAMICIN SULFATE 40 MG/ML IJ SOLN
5.0000 mg/kg | INTRAVENOUS | Status: AC
  Administered 2020-01-21: 340 mg via INTRAVENOUS
  Filled 2020-01-21 (×2): qty 8.5

## 2020-01-21 MED ORDER — ROCURONIUM BROMIDE 10 MG/ML (PF) SYRINGE
PREFILLED_SYRINGE | INTRAVENOUS | Status: AC
  Filled 2020-01-21: qty 10

## 2020-01-21 MED ORDER — BUPIVACAINE LIPOSOME 1.3 % IJ SUSP
INTRAMUSCULAR | Status: DC | PRN
  Administered 2020-01-21: 20 mL

## 2020-01-21 MED ORDER — LIDOCAINE 2% (20 MG/ML) 5 ML SYRINGE
INTRAMUSCULAR | Status: DC | PRN
  Administered 2020-01-21: 100 mg via INTRAVENOUS

## 2020-01-21 MED ORDER — SUGAMMADEX SODIUM 500 MG/5ML IV SOLN
INTRAVENOUS | Status: AC
  Filled 2020-01-21: qty 5

## 2020-01-21 MED ORDER — ACETAMINOPHEN 500 MG PO TABS
1000.0000 mg | ORAL_TABLET | ORAL | Status: AC
  Administered 2020-01-21: 10:00:00 1000 mg via ORAL
  Filled 2020-01-21: qty 2

## 2020-01-21 MED ORDER — LIDOCAINE 2% (20 MG/ML) 5 ML SYRINGE
INTRAMUSCULAR | Status: AC
  Filled 2020-01-21: qty 5

## 2020-01-21 MED ORDER — CELECOXIB 200 MG PO CAPS
200.0000 mg | ORAL_CAPSULE | ORAL | Status: AC
  Administered 2020-01-21: 200 mg via ORAL
  Filled 2020-01-21: qty 1

## 2020-01-21 MED ORDER — BUPIVACAINE-EPINEPHRINE 0.25% -1:200000 IJ SOLN
INTRAMUSCULAR | Status: DC | PRN
  Administered 2020-01-21: 30 mL

## 2020-01-21 MED ORDER — FENTANYL CITRATE (PF) 100 MCG/2ML IJ SOLN
INTRAMUSCULAR | Status: AC
  Filled 2020-01-21: qty 2

## 2020-01-21 MED ORDER — GABAPENTIN 300 MG PO CAPS
300.0000 mg | ORAL_CAPSULE | ORAL | Status: AC
  Administered 2020-01-21: 10:00:00 300 mg via ORAL
  Filled 2020-01-21: qty 1

## 2020-01-21 MED ORDER — PROPOFOL 10 MG/ML IV BOLUS
INTRAVENOUS | Status: DC | PRN
  Administered 2020-01-21: 160 mg via INTRAVENOUS

## 2020-01-21 MED ORDER — LACTATED RINGERS IV SOLN
INTRAVENOUS | Status: DC
  Administered 2020-01-21: 10:00:00 50 mL/h via INTRAVENOUS
  Filled 2020-01-21: qty 1000

## 2020-01-21 MED ORDER — CLINDAMYCIN PHOSPHATE 900 MG/50ML IV SOLN
900.0000 mg | INTRAVENOUS | Status: AC
  Administered 2020-01-21: 12:00:00 900 mg via INTRAVENOUS
  Filled 2020-01-21: qty 50

## 2020-01-21 MED ORDER — FENTANYL CITRATE (PF) 100 MCG/2ML IJ SOLN
25.0000 ug | INTRAMUSCULAR | Status: DC | PRN
  Administered 2020-01-21 (×2): 50 ug via INTRAVENOUS
  Filled 2020-01-21: qty 1

## 2020-01-21 MED ORDER — WHITE PETROLATUM EX OINT
TOPICAL_OINTMENT | CUTANEOUS | Status: AC
  Filled 2020-01-21: qty 5

## 2020-01-21 MED ORDER — ROCURONIUM BROMIDE 10 MG/ML (PF) SYRINGE
PREFILLED_SYRINGE | INTRAVENOUS | Status: DC | PRN
  Administered 2020-01-21: 70 mg via INTRAVENOUS

## 2020-01-21 MED ORDER — ONDANSETRON HCL 4 MG/2ML IJ SOLN
INTRAMUSCULAR | Status: DC | PRN
  Administered 2020-01-21: 4 mg via INTRAVENOUS

## 2020-01-21 MED ORDER — OXYCODONE HCL 5 MG PO TABS: 5.0000 mg | ORAL_TABLET | Freq: Four times a day (QID) | ORAL | 0 refills | Status: DC | PRN

## 2020-01-21 MED ORDER — DEXAMETHASONE SODIUM PHOSPHATE 10 MG/ML IJ SOLN
INTRAMUSCULAR | Status: DC | PRN
  Administered 2020-01-21: 10 mg via INTRAVENOUS

## 2020-01-21 MED ORDER — ONDANSETRON HCL 4 MG/2ML IJ SOLN
INTRAMUSCULAR | Status: AC
  Filled 2020-01-21: qty 2

## 2020-01-21 MED ORDER — ACETAMINOPHEN 500 MG PO TABS
ORAL_TABLET | ORAL | Status: AC
  Filled 2020-01-21: qty 2

## 2020-01-21 MED ORDER — SUGAMMADEX SODIUM 200 MG/2ML IV SOLN
INTRAVENOUS | Status: DC | PRN
  Administered 2020-01-21: 300 mg via INTRAVENOUS

## 2020-01-21 MED ORDER — CLINDAMYCIN PHOSPHATE 900 MG/50ML IV SOLN
INTRAVENOUS | Status: AC
  Filled 2020-01-21: qty 50

## 2020-01-21 MED ORDER — GABAPENTIN 300 MG PO CAPS
ORAL_CAPSULE | ORAL | Status: AC
  Filled 2020-01-21: qty 1

## 2020-01-21 MED ORDER — FENTANYL CITRATE (PF) 100 MCG/2ML IJ SOLN
INTRAMUSCULAR | Status: DC | PRN
  Administered 2020-01-21 (×2): 50 ug via INTRAVENOUS

## 2020-01-21 MED ORDER — MIDAZOLAM HCL 2 MG/2ML IJ SOLN
INTRAMUSCULAR | Status: DC | PRN
  Administered 2020-01-21: 2 mg via INTRAVENOUS

## 2020-01-21 SURGICAL SUPPLY — 47 items
APL SKNCLS STERI-STRIP NONHPOA (GAUZE/BANDAGES/DRESSINGS) ×1
BENZOIN TINCTURE PRP APPL 2/3 (GAUZE/BANDAGES/DRESSINGS) ×3 IMPLANT
BLADE HEX COATED 2.75 (ELECTRODE) ×3 IMPLANT
BLADE SURG 15 STRL LF DISP TIS (BLADE) ×1 IMPLANT
BLADE SURG 15 STRL SS (BLADE) ×3
BRIEF STRETCH FOR OB PAD LRG (UNDERPADS AND DIAPERS) ×3 IMPLANT
CANISTER SUCT 1200ML W/VALVE (MISCELLANEOUS) ×3 IMPLANT
COVER BACK TABLE 60X90IN (DRAPES) ×3 IMPLANT
COVER MAYO STAND STRL (DRAPES) ×3 IMPLANT
COVER WAND RF STERILE (DRAPES) ×3 IMPLANT
DECANTER SPIKE VIAL GLASS SM (MISCELLANEOUS) ×3 IMPLANT
DRAPE HYSTEROSCOPY (DRAPE) ×3 IMPLANT
DRAPE LAPAROTOMY 100X72 PEDS (DRAPES) ×3 IMPLANT
DRSG PAD ABDOMINAL 8X10 ST (GAUZE/BANDAGES/DRESSINGS) ×3 IMPLANT
ELECT NEEDLE TIP 2.8 STRL (NEEDLE) ×3 IMPLANT
ELECT REM PT RETURN 9FT ADLT (ELECTROSURGICAL) ×3
ELECTRODE REM PT RTRN 9FT ADLT (ELECTROSURGICAL) ×1 IMPLANT
GAUZE SPONGE 4X4 12PLY STRL (GAUZE/BANDAGES/DRESSINGS) ×3 IMPLANT
GAUZE SPONGE 4X4 12PLY STRL LF (GAUZE/BANDAGES/DRESSINGS) ×3 IMPLANT
GLOVE BIO SURGEON STRL SZ 6.5 (GLOVE) ×4 IMPLANT
GLOVE BIO SURGEONS STRL SZ 6.5 (GLOVE) ×2
GLOVE BIOGEL PI IND STRL 6.5 (GLOVE) ×2 IMPLANT
GLOVE BIOGEL PI INDICATOR 6.5 (GLOVE) ×4
GLOVE ECLIPSE 8.0 STRL XLNG CF (GLOVE) ×3 IMPLANT
GLOVE INDICATOR 8.0 STRL GRN (GLOVE) ×3 IMPLANT
GOWN STRL REUS W/TWL LRG LVL3 (GOWN DISPOSABLE) ×6 IMPLANT
GOWN STRL REUS W/TWL XL LVL3 (GOWN DISPOSABLE) ×3 IMPLANT
KIT TURNOVER CYSTO (KITS) ×3 IMPLANT
NEEDLE HYPO 22GX1.5 SAFETY (NEEDLE) ×3 IMPLANT
NS IRRIG 500ML POUR BTL (IV SOLUTION) ×3 IMPLANT
PACK BASIN DAY SURGERY FS (CUSTOM PROCEDURE TRAY) ×3 IMPLANT
PAD PREP 24X48 CUFFED NSTRL (MISCELLANEOUS) ×3 IMPLANT
PENCIL BUTTON HOLSTER BLD 10FT (ELECTRODE) ×3 IMPLANT
SCRUB TECHNI CARE 4 OZ NO DYE (MISCELLANEOUS) ×3 IMPLANT
SURGILUBE 2OZ TUBE FLIPTOP (MISCELLANEOUS) ×3 IMPLANT
SUT CHROMIC 3 0 SH 27 (SUTURE) ×3 IMPLANT
SUT VIC AB 2-0 UR6 27 (SUTURE) ×18 IMPLANT
SYR 20ML LL LF (SYRINGE) ×3 IMPLANT
SYR 27GX1/2 1ML LL SAFETY (SYRINGE) ×3 IMPLANT
SYR BULB IRRIGATION 50ML (SYRINGE) ×3 IMPLANT
TAPE CLOTH 3X10 TAN LF (GAUZE/BANDAGES/DRESSINGS) ×3 IMPLANT
TOWEL OR 17X26 10 PK STRL BLUE (TOWEL DISPOSABLE) ×3 IMPLANT
TRAY DSU PREP LF (CUSTOM PROCEDURE TRAY) ×3 IMPLANT
TUBE CONNECTING 12'X1/4 (SUCTIONS) ×1
TUBE CONNECTING 12X1/4 (SUCTIONS) ×2 IMPLANT
UNDERPAD 30X30 (UNDERPADS AND DIAPERS) ×3 IMPLANT
YANKAUER SUCT BULB TIP NO VENT (SUCTIONS) ×3 IMPLANT

## 2020-01-21 NOTE — H&P (Signed)
Cassandra Warren  DOB: February 08, 1966  `  `  `  Patient Care Team: [name], [place] as PCP - General (Physician Assistant) Obgyn, Erling Conte as Consulting Physician (Obstetrics and Gynecology)   Chief Complaint: Perianal lesion.  `  `  The patient is a healthy woman his noticeable lump near her anus for the past few years. Presumed to be an external hemorrhoid. Felt that on a vacation many years ago with an episode of bleeding. No severe pain. Had a normal colon. Guarded age 54.Marland Kitchen Somewhat irritating at times. Concern for slightly larger. Sent to see gastroenterology. Did not seem like a classic hemorrhoid. Colonoscopy recommended. Hyperplastic polyp removed. Some nonspecific colitis mildly noted in right colon. Ileum clear. Pathology did not feel it was classic for inflammatory bowel disease. Favored NSAID or infectious related. Patient does note that she had a staging by a stingray and was on antibiotics and steroids for a few weeks this fall. Not really on any nonsteroidals. No obvious fissure or fistula. Surgical consultation requested.   She does not smoke. Not diabetic. No blood thinners. No sleep apnea. Otherwise rather healthy. No personal nor family history of GI/colon cancer, inflammatory bowel disease, irritable bowel syndrome, allergy such as Celiac Sprue, dietary/dairy problems, colitis, ulcers nor gastritis. No recent sick contacts/gastroenteritis. No travel outside the country. No changes in diet. No dysphagia to solids or liquids. No significant heartburn or reflux. No hematochezia, hematemesis, coffee ground emesis. No evidence of prior gastric/peptic ulceration.  (Review of systems as stated in this history (HPI) or in the review of systems. Otherwise all other 12 point ROS are negative)  `  `  `  Past Surgical History Sabino Gasser, [place]; 10/05/2019 8:52 AM)  Cesarean Section - 1  Colon Polyp Removal - Colonoscopy  Diagnostic Studies History Sabino Gasser, CMA; 10/05/2019 8:52 AM)    Colonoscopy within last year  Mammogram within last year  Pap Smear 1-5 years ago  Allergies Sabino Gasser, [place]; 10/05/2019 8:53 AM)  Erythromycin Derivatives  Morphine Derivatives  Penicillins  Allergies Reconciled  Medication History Sabino Gasser, CMA; 10/05/2019 8:54 AM)  Lisinopril (10MG  Tablet, Oral) Active.  Synthroid (50MCG Tablet, Oral) Active.  Medications Reconciled  Pregnancy / Birth History Sabino Gasser, [place]; 10/05/2019 8:52 AM)  Age at menarche 46 years.  Gravida 1  Maternal age 36-20  Para 1  Regular periods  Other Problems Sabino Gasser, CMA; 10/05/2019 8:52 AM)  High blood pressure  Oophorectomy Right.  Thyroid Disease  Review of Systems Sabino Gasser CMA; 10/05/2019 8:52 AM)  General Not Present- Appetite Loss, Chills, Fatigue, Fever, Night Sweats, Weight Gain and Weight Loss.  Skin Not Present- Change in Wart/Mole, Dryness, Hives, Jaundice, New Lesions, Non-Healing Wounds, Rash and Ulcer.  HEENT Not Present- Earache, Hearing Loss, Hoarseness, Nose Bleed, Oral Ulcers, Ringing in the Ears, Seasonal Allergies, Sinus Pain, Sore Throat, Visual Disturbances, Wears glasses/contact lenses and Yellow Eyes.  Respiratory Not Present- Bloody sputum, Chronic Cough, Difficulty Breathing, Snoring and Wheezing.  Breast Not Present- Breast Mass, Breast Pain, Nipple Discharge and Skin Changes.  Cardiovascular Not Present- Chest Pain, Difficulty Breathing Lying Down, Leg Cramps, Palpitations, Rapid Heart Rate, Shortness of Breath and Swelling of Extremities.  Gastrointestinal Present- Bloating. Not Present- Abdominal Pain, Bloody Stool, Change in Bowel Habits, Chronic diarrhea, Constipation, Difficulty Swallowing, Excessive gas, Gets full quickly at meals, Hemorrhoids, Indigestion, Nausea, Rectal Pain and Vomiting.  Female Genitourinary Not Present- Frequency, Nocturia, Painful Urination, Pelvic Pain and Urgency.  Musculoskeletal Not Present- Back Pain, Joint  Pain, Joint Stiffness,  Muscle Pain, Muscle Weakness and Swelling of Extremities.  Neurological Not Present- Decreased Memory, Fainting, Headaches, Numbness, Seizures, Tingling, Tremor, Trouble walking and Weakness.  Psychiatric Not Present- Anxiety, Bipolar, Change in Sleep Pattern, Depression, Fearful and Frequent crying.  Endocrine Not Present- Cold Intolerance, Excessive Hunger, Hair Changes, Heat Intolerance, Hot flashes and New Diabetes.  Hematology Not Present- Blood Thinners, Easy Bruising, Excessive bleeding, Gland problems, HIV and Persistent Infections.  Vitals Sabino Gasser CMA; 10/05/2019 8:55 AM)  10/05/2019 8:54 AM  Weight: 152.6 lb Height: 63 in  Body Surface Area: 1.72 m Body Mass Index: 27.03 kg/m  Temp.: 98.3 F (Tympanic) Pulse: 115 (Regular)  BP: 110/72 (Sitting, Left Arm, Standard)  Physical Exam Adin Hector MD; 10/05/2019 9:08 AM)  General  Mental Status - Alert.  General Appearance - Not in acute distress, Not Sickly.  Orientation - Oriented X3.  Hydration - Well hydrated.  Voice - Normal.  Integumentary  Global Assessment  Upon inspection and palpation of skin surfaces of the - Axillae: non-tender, no inflammation or ulceration, no drainage. and Distribution of scalp and body hair is normal.  General Characteristics  Temperature - normal warmth is noted.  Head and Neck  Head - normocephalic, atraumatic with no lesions or palpable masses.  Face  Global Assessment - atraumatic, no absence of expression.  Neck  Global Assessment - no abnormal movements, no bruit auscultated on the right, no bruit auscultated on the left, no decreased range of motion, non-tender.  Trachea - midline.  Thyroid  Gland Characteristics - non-tender.  Eye  Eyeball - Left - Extraocular movements intact, No Nystagmus - Left.  Eyeball - Right - Extraocular movements intact, No Nystagmus - Right.  Cornea - Left - No Hazy - Left.  Cornea - Right - No Hazy - Right.  Sclera/Conjunctiva - Left - No scleral  icterus, No Discharge - Left.  Sclera/Conjunctiva - Right - No scleral icterus, No Discharge - Right.  Pupil - Left - Direct reaction to light normal.  Pupil - Right - Direct reaction to light normal.  ENMT  Ears  Pinna - Left - no drainage observed, no generalized tenderness observed. Pinna - Right - no drainage observed, no generalized tenderness observed.  Nose and Sinuses  External Inspection of the Nose - no destructive lesion observed. Inspection of the nares - Left - quiet respiration. Inspection of the nares - Right - quiet respiration.  Mouth and Throat  Lips - Upper Lip - no fissures observed, no pallor noted. Lower Lip - no fissures observed, no pallor noted. Nasopharynx - no discharge present. Oral Cavity/Oropharynx - Tongue - no dryness observed. Oral Mucosa - no cyanosis observed. Hypopharynx - no evidence of airway distress observed.  Chest and Lung Exam  Inspection  Movements - Normal and Symmetrical. Accessory muscles - No use of accessory muscles in breathing.  Palpation  Palpation of the chest reveals - Non-tender.  Auscultation  Breath sounds - Normal and Clear.  Cardiovascular  Auscultation  Rhythm - Regular. Murmurs & Other Heart Sounds - Auscultation of the heart reveals - No Murmurs and No Systolic Clicks.  Abdomen  Inspection  Inspection of the abdomen reveals - No Visible peristalsis and No Abnormal pulsations. Umbilicus - No Bleeding, No Urine drainage.  Palpation/Percussion  Palpation and Percussion of the abdomen reveal - Soft, Non Tender, No Rebound tenderness, No Rigidity (guarding) and No Cutaneous hyperesthesia.  Note: Abdomen soft. Not severely distended. No diastasis recti. Supraumbilical piercing clean.  Low midline incision. No umbilical or other anterior abdominal wall hernias  Female Genitourinary  Sexual Maturity  Tanner 5 - Adult hair pattern.  Note: No vaginal bleeding nor discharge  Rectal  Note: Left lateral smooth perianal nodule 15 x 10 mm.  Not verruca's her ulcerated necessarily. Within a centimeters anal verge.  Perianal skin clean with good hygiene. No pruritis ani. No pilonidal disease. No fissure. No abscess/fistula. Normal sphincter tone.  No external hemorrhoids. No condyloma warts. Tolerates digital and anoscopic rectal exam. No rectal masses. Hemorrhoidal piles grade 1-2.  Exam done with assistance of female Medical Assistant in the room.  Peripheral Vascular  Upper Extremity  Inspection - Left - No Cyanotic nailbeds - Left, Not Ischemic. Inspection - Right - No Cyanotic nailbeds - Right, Not Ischemic.  Neurologic  Neurologic evaluation reveals - normal attention span and ability to concentrate, able to name objects and repeat phrases. Appropriate fund of knowledge , normal sensation and normal coordination.  Mental Status  Affect - not angry, not paranoid.  Cranial Nerves - Normal Bilaterally.  Gait - Normal.  Neuropsychiatric  Mental status exam performed with findings of - able to articulate well with normal speech/language, rate, volume and coherence, thought content normal with ability to perform basic computations and apply abstract reasoning and no evidence of hallucinations, delusions, obsessions or homicidal/suicidal ideation.  Musculoskeletal  Global Assessment  Spine, Ribs and Pelvis - no instability, subluxation or laxity. Right Upper Extremity - no instability, subluxation or laxity.  Lymphatic  Head & Neck  General Head & Neck Lymphatics: Bilateral - Description - No Localized lymphadenopathy.  Axillary  General Axillary Region: Bilateral - Description - No Localized lymphadenopathy.  Femoral & Inguinal  Generalized Femoral & Inguinal Lymphatics: Left - Description - No Localized lymphadenopathy. Right - Description - No Localized lymphadenopathy.  Assessment & Plan Adin Hector MD; 10/05/2019 10:20 AM)    PERIANAL MASS (K62.89)  Impression: Perianal cystic mass. Been there for several years. Not  particularly large. Does not give a story for a fistula or an abscess. Does not look like a hemorrhoid nor a wart.  Think it would make sense to remove this with outpatient surgery. Examination under anesthesia. Consider injecting with methylene blue to make sure there is no fistula there.  She recently has semiretired is doing a lot of traveling. I cautioned against trying to do any traveling within the first few weeks after this and she will need time to recover from pain or discomfort. Ideally wait 4-6 weeks. This is been there for several years and has not rapidly changing, so can wait a few months until more convenient time.   Current Plans  ANOSCOPY, DIAGNOSTIC [id number])  You are being scheduled for surgery - Our schedulers will call you.  You should hear from our office's scheduling department within 5 working days about the location, date, and time of surgery. We try to make accommodations for patient's preferences in scheduling surgery, but sometimes the OR schedule or the surgeon's schedule prevents [place] from making those accommodations.  If you have not heard from our office [phone]) in 5 working days, call the office and ask for your surgeon's nurse.  If you have other questions about your diagnosis, plan, or surgery, call the office and ask for your surgeon's nurse.  PROLAPSED INTERNAL HEMORRHOIDS, GRADE 2 (K64.1)  Impression: Some mildly enlarged hemorrhoids especially right anterior. I offered hemorrhoidal ligation and pexy at the time of surgery. May need excision as well, but  doubtful. She likes the idea of having any hemorrhoidal issues addressed at the time of surgery as well.  Current Plans  Pt Education - [facility] Hemorrhoids (Belvia Gotschall): discussed with patient and provided information.    ENCOUNTER FOR PREOPERATIVE EXAMINATION FOR GENERAL SURGICAL PROCEDURE [id number])  Current Plans  The anatomy & physiology of the anorectal region was discussed. The pathophysiology of hemorrhoids and  differential diagnosis was discussed. Natural history risks without surgery was discussed. I stressed the importance of a bowel regimen to have daily soft bowel movements to minimize progression of disease. Interventions such as sclerotherapy & banding were discussed.  The patient's symptoms are not adequately controlled by medicines and other non-operative treatments. I feel the risks & problems of no surgery outweigh the operative risks; therefore, I recommended surgery to treat the hemorrhoids by ligation, pexy, and possible resection.  Risks such as bleeding, infection, urinary difficulties, need for further treatment, heart attack, death, and other risks were discussed. I noted a good likelihood this will help address the problem. Goals of post-operative recovery were discussed as well. Possibility that this will not correct all symptoms was explained. Post-operative pain, bleeding, constipation, and other problems after surgery were discussed. We will work to minimize complications. Educational handouts further explaining the pathology, treatment options, and bowel regimen were given as well. Questions were answered. .  The patient expresses understanding & wishes to proceed with surgery.     Adin Hector, MD, FACS, MASCRS  Gastrointestinal and Minimally Invasive Surgery  [facility]  [address], [place]  [place], [place] [address]  [phone] Main / Paging  [phone] Fax

## 2020-01-21 NOTE — Discharge Instructions (Signed)
Information for Discharge Teaching: EXPAREL (bupivacaine liposome injectable suspension)   Your surgeon gave you EXPAREL(bupivacaine) in your surgical incision to help control your pain after surgery.   EXPAREL is a local anesthetic that provides pain relief by numbing the tissue around the surgical site.  EXPAREL is designed to release pain medication over time and can control pain for up to 72 hours.  Depending on how you respond to EXPAREL, you may require less pain medication during your recovery.  Possible side effects:  Temporary loss of sensation or ability to move in the area where bupivacaine was injected.  Nausea, vomiting, constipation  Rarely, numbness and tingling in your mouth or lips, lightheadedness, or anxiety may occur.  Call your doctor right away if you think you may be experiencing any of these sensations, or if you have other questions regarding possible side effects.  Follow all other discharge instructions given to you by your surgeon or nurse. Eat a healthy diet and drink plenty of water or other fluids.  If you return to the hospital for any reason within 96 hours following the administration of EXPAREL, please inform your health care providers. Post Anesthesia Home Care Instructions  Activity: Get plenty of rest for the remainder of the day. A responsible adult should stay with you for 24 hours following the procedure.  For the next 24 hours, DO NOT: -Drive a car -Paediatric nurse -Drink alcoholic beverages -Take any medication unless instructed by your physician -Make any legal decisions or sign important papers.  Meals: Start with liquid foods such as gelatin or soup. Progress to regular foods as tolerated. Avoid greasy, spicy, heavy foods. If nausea and/or vomiting occur, drink only clear liquids until the nausea and/or vomiting subsides. Call your physician if vomiting continues.  Special Instructions/Symptoms: Your throat may feel dry or sore  from the anesthesia or the breathing tube placed in your throat during surgery. If this causes discomfort, gargle with warm salt water. The discomfort should disappear within 24 hours.  If you had a scopolamine patch placed behind your ear for the management of post- operative nausea and/or vomiting:  1. The medication in the patch is effective for 72 hours, after which it should be removed.  Wrap patch in a tissue and discard in the trash. Wash hands thoroughly with soap and water. 2. You may remove the patch earlier than 72 hours if you experience unpleasant side effects which may include dry mouth, dizziness or visual disturbances. 3. Avoid touching the patch. Wash your hands with soap and water after contact with the patch.   ANORECTAL SURGERY:  POST OPERATIVE INSTRUCTIONS  ######################################################################  EAT Start with a pureed / full liquid diet After 24 hours, gradually transition to a high fiber diet.    CONTROL PAIN Control pain so you can tolerate bowel movements,  walk, sleep, tolerate sneezing/coughing, and go up/down stairs.   HAVE A BOWEL MOVEMENT DAILY Keep your bowels regular to avoid problems.   Taking a fiber supplement every day to keep bowels soft.   Try a laxative to override constipation. Use an antidairrheal to slow down diarrhea.   Call if not better after 2 tries  WALK Walk an hour a day.  Control your pain to do that.   CALL IF YOU HAVE PROBLEMS/CONCERNS Call if you are still struggling despite following these instructions. Call if you have concerns not answered by these instructions  ######################################################################    1. Take your usually prescribed home medications unless otherwise directed.  2. DIET: Follow a light bland diet & liquids the first 24 hours after arrival home, such as soup, liquids, starches, etc.  Be sure to drink plenty of fluids.  Quickly advance to a  usual solid diet within a few days.  Avoid fast food or heavy meals as your are more likely to get nauseated or have irregular bowels.  A low-fat, high-fiber diet for the rest of your life is ideal.  3. PAIN CONTROL: a. Pain is best controlled by a usual combination of three different methods TOGETHER: i. Ice/Heat ii. Over the counter pain medication iii. Prescription pain medication b. Expect swelling and discomfort in the anus/rectal area.  Warm water baths (30-60 minutes up to 6 times a day, especially after bowel meovements) will help. Use ice for the first few days to help decrease swelling and bruising, then switch to heat such as warm towels, sitz baths, warm baths, etc to help relax tight/sore spots and speed recovery.  Some people prefer to use ice alone, heat alone, alternating between ice & heat.  Experiment to what works for you.   c. It is helpful to take an over-the-counter pain medication continuously for the first few weeks.  Choose one of the following that works best for you: i. Naproxen (Aleve, etc)  Two 220mg  tabs twice a day ii. Ibuprofen (Advil, etc) Three 200mg  tabs four times a day (every meal & bedtime) iii. Acetaminophen (Tylenol, etc) 500-650mg  four times a day (every meal & bedtime) d. A  prescription for pain medication (such as oxycodone, hydrocodone, etc) should be given to you upon discharge.  Take your pain medication as prescribed.  i. If you are having problems/concerns with the prescription medicine (does not control pain, nausea, vomiting, rash, itching, etc), please call us [phone] to see if we need to switch you to a different pain medicine that will work better for you and/or control your side effect better. ii. If you need a refill on your pain medication, please contact your pharmacy.  They will contact our office to request authorization. Prescriptions will not be filled after 5 pm or on week-ends.  If can take up to 48 hours for it to be filled & ready  so avoid waiting until you are down to thel ast pill. e. A topical cream (Dibucaine) or a prescription for a cream (such as diltiazem 2% gel) may be given to you.  Many people find relief with topical creams.  Some people find it burns too much.  Experiment.  If it helps, use it.  If it burns, don't using it.  Use a Sitz Bath 4-8 times a day for relief   [employer] A sitz bath is a warm water bath taken in the sitting position that covers only the hips and buttocks. It may be used for either healing or hygiene purposes. Sitz baths are also used to relieve pain, itching, or muscle spasms. The water may contain medicine. Moist heat will help you heal and relax.  HOME CARE INSTRUCTIONS  Take 3 to 4 sitz baths a day. 1. Fill the bathtub half full with warm water. 2. Sit in the water and open the drain a little. 3. Turn on the warm water to keep the tub half full. Keep the water running constantly. 4. Soak in the water for 15 to 20 minutes. 5. After the sitz bath, pat the affected area dry first.   4. KEEP YOUR BOWELS REGULAR a. The goal is one soft bowel  movement a day b. Avoid getting constipated.  Between the surgery and the pain medications, it is common to experience some constipation.  Increasing fluid intake and taking a fiber supplement (such as Metamucil, Citrucel, FiberCon, MiraLax, etc) 2-3 times a day regularly will usually help prevent this problem from occurring.  A mild laxative (prune juice, Milk of Magnesia, MiraLax, etc) should be taken according to package directions if there are no bowel movements after 48 hours. c. Watch out for diarrhea.  If you have many loose bowel movements, simplify your diet to bland foods & liquids for a few days.  Stop any stool softeners and decrease your fiber supplement.  Switching to mild anti-diarrheal medications (Kayopectate, Pepto Bismol) can help.  Can try an imodium/loperamide dose.  If this worsens or does not improve, please call us.  5. Wound  Care  a. Remove your bandages with your first bowel movement, usually the day after surgery.  You may have packing if you had an abscess.  Let any packing or gauze fall come out.   b. Wear an absorbent pad or soft cotton balls in your underwear as needed to catch any drainage and help keep the area  c. Keep the area clean and dry.  Bathe / shower every day.  Keep the area clean by showering / bathing over the incision / wound.   It is okay to soak an open wound to help wash it.  Consider using a squeeze bottle filled with warm water to gently wash the anal area.  Wet wipes or showers / gentle washing after bowel movements is often less traumatic than regular toilet paper. d. Dennis Bast will often notice bleeding with bowel movements.  This should slow down by the end of the first week of surgery.  Sitting on an ice pack can help. e. Expect some drainage.  This should slow down by the end of the first week of surgery, but you will have occasional bleeding or drainage up to a few months after surgery.  Wear an absorbent pad or soft cotton gauze in your underwear until the drainage stops.  6. ACTIVITIES as tolerated:   a. You may resume regular (light) daily activities beginning the next day--such as daily self-care, walking, climbing stairs--gradually increasing activities as tolerated.  If you can walk 30 minutes without difficulty, it is safe to try more intense activity such as jogging, treadmill, bicycling, low-impact aerobics, swimming, etc. b. Save the most intensive and strenuous activity for last such as sit-ups, heavy lifting, contact sports, etc  Refrain from any heavy lifting or straining until you are off narcotics for pain control.   c. DO NOT PUSH THROUGH PAIN.  Let pain be your guide: If it hurts to do something, don't do it.  Pain is your body warning you to avoid that activity for another week until the pain goes down. d. You may drive when you are no longer taking prescription pain medication,  you can comfortably sit for long periods of time, and you can safely maneuver your car and apply brakes. e. Dennis Bast may have sexual intercourse when it is comfortable.  7. FOLLOW UP in our office a. Please call [facility] at [phone] to set up an appointment to see your surgeon in the office for a follow-up appointment approximately 2-3 weeks after your surgery. b. Make sure that you call for this appointment the day you arrive home to ensure a convenient appointment time.  8. IF YOU HAVE DISABILITY OR FAMILY  LEAVE FORMS, BRING THEM TO THE OFFICE FOR PROCESSING.  DO NOT GIVE THEM TO YOUR DOCTOR.        WHEN TO CALL us [phone]: 1. Poor pain control 2. Reactions / problems with new medications (rash/itching, nausea, etc)  3. Fever over 101.5 F (38.5 C) 4. Inability to urinate 5. Nausea and/or vomiting 6. Worsening swelling or bruising 7. Continued bleeding from incision. 8. Increased pain, redness, or drainage from the incision  The clinic staff is available to answer your questions during regular business hours (8:30am-5pm).  Please don't hesitate to call and ask to speak to one of our nurses for clinical concerns.   A surgeon from [facility] is always on call at the hospitals   If you have a medical emergency, go to the nearest emergency room or call 911.    [facility], [place], [place], [place], [place]  [address] ? MAIN: [phone] ? TOLL FREE: [phone] ? FAX [phone] [email or web address]   HEMORRHOIDS  The rectum is the last foot of your colon, and it naturally stretches to hold stool.  Hemorrhoidal piles are natural clusters of blood vessels that help the rectum and anal canal stretch to hold stool and allow bowel movements to eliminate feces.   Hemorrhoids are abnormally swollen blood vessels in the rectum.  Too much pressure in the rectum causes hemorrhoids by forcing blood to stretch and bulge the  walls of the veins, sometimes even rupturing them.  Hemorrhoids can become like varicose veins you might see on a person's legs.  Most people will develop a flare of hemorrhoids in their lifetime.  When bulging hemorrhoidal veins are irritated, they can swell, burn, itch, cause pain, and bleed.  Most flares will calm down gradually own within a few weeks.  However, once hemorrhoids are created, they are difficult to get rid of completely and tend to flare more easily than the first flare.   Fortunately, good habits and simple medical treatment usually control hemorrhoids well, and surgery is needed only in severe cases. Types of Hemorrhoids:  Internal hemorrhoids usually don't initially hurt or itch; they are deep inside the rectum and usually have no sensation. If they begin to push out (prolapse), pain and burning can occur.  However, internal hemorrhoids can bleed.  Anal bleeding should not be ignored since bleeding could come from a dangerous source like colorectal cancer, so persistent rectal bleeding should be investigated by a doctor, sometimes with a colonoscopy.  External hemorrhoids cause most of the symptoms - pain, burning, and itching. Nonirritated hemorrhoids can look like small skin tags coming out of the anus.   Thrombosed hemorrhoids can form when a hemorrhoid blood vessel bursts and causes the hemorrhoid to suddenly swell.  A purple blood clot can form in it and become an excruciatingly painful lump at the anus. Because of these unpleasant symptoms, immediate incision and drainage by a surgeon at an office visit can provide much relief of the pain.    PREVENTION Avoiding the most frequent causes listed below will prevent most cases of hemorrhoids: Constipation Hard stools Diarrhea  Constant sitting  Straining with bowel movements Sitting on the toilet for a long time  Severe coughing  episodes Pregnancy / Childbirth  Heavy Lifting  Sometimes avoiding the above triggers is  difficult:  How can you avoid sitting all day if you have a seated job? Also, we try to avoid coughing and diarrhea, but sometimes it's beyond your  control.  Still, there are some practical hints to help: Keep the anal and genital area clean.  Moistened tissues such as flushable wet wipes are less irritating than toilet paper.  Using irrigating showers or bottle irrigation washing gently cleans this sensitive area.   Avoid dry toilet paper when cleaning after bowel movements.  Marland Kitchen Keep the anal and genital area dry.  Lightly pat the rectal area dry.  Avoid rubbing.  Talcum or baby powders can help GET YOUR STOOLS SOFT.   This is the most important way to prevent irritated hemorrhoids.  Hard stools are like sandpaper to the anorectal canal and will cause more problems.  The goal: ONE SOFT BOWEL MOVEMENT A DAY!  BMs from every other day to 3 times a day is a tolerable range Treat coughing, diarrhea and constipation early since irritated hemorrhoids may soon follow.  If your main job activity is seated, always stand or walk during your breaks. Make it a point to stand and walk at least 5 minutes every hour and try to shift frequently in your chair to avoid direct rectal pressure.  Always exhale as you strain or lift. Don't hold your breath.  Do not delay or try to prevent a bowel movement when the urge is present. Exercise regularly (walking or jogging 60 minutes a day) to stimulate the bowels to move. No reading or other activity while on the toilet. If bowel movements take longer than 5 minutes, you are too constipated. AVOID CONSTIPATION Drink plenty of liquids (1 1/2 to 2 quarts of water and other fluids a day unless fluid restricted for another medical condition). Liquids that contain caffeine (coffee a, tea, soft drinks) can be dehydrating and should be avoided until constipation is controlled. Consider minimizing milk, as dairy products may be constipating. Eat plenty of fiber (30g a day ideal, more if  needed).  Fiber is the undigested part of plant food that passes into the colon, acting as "natures broom" to encourage bowel motility and movement.  Fiber can absorb and hold large amounts of water. This results in a larger, bulkier stool, which is soft and easier to pass.  Eating foods high in fiber - 12 servings - such as  Vegetables: Root (potatoes, carrots, turnips), Leafy green (lettuce, salad greens, celery, spinach), High residue (cabbage, broccoli, etc.) Fruit: Fresh, Dried (prunes, apricots, cherries), Stewed (applesauce)  Whole grain breads, pasta, whole wheat Bran cereals, muffins, etc. Consider adding supplemental bulking fiber which retains large volumes of water: Psyllium ground seeds (native plant from [place])--available as Metamucil, Konsyl, Effersyllium, Per Diem Fiber, or the less expensive generic forms.  Citrucel  (methylcellulose wood fiber) . FiberCon (Polycarbophil) Polyethylene Glycol - and "artificial" fiber commonly called Miralax or Glycolax.  It is helpful for people with gassy or bloated feelings with regular fiber Flax Seed - a less gassy natural fiber  Laxatives can be useful for a short period if constipation is severe Osmotics (Milk of Magnesia, Fleets Phospho-Soda, Magnesium Citrate)  Stimulants (Senokot,   Castor Oil,  Dulcolax, Ex-Lax)    Laxatives are not a good long-term solution as it can stress the bowels and cause too much mineral loss and dehydration.   Avoid taking laxatives for more than 7 days in a row.  AVOID DIARRHEA Switch to liquids and simpler foods for a few days to avoid stressing your intestines further. Avoid dairy products (especially milk & ice cream) for a short time.  The intestines often can lose the ability to digest  lactose when stressed. Avoid foods that cause gassiness or bloating.  Typical foods include beans and other legumes, cabbage, broccoli, and dairy foods.  Every person has some sensitivity to other foods, so listen to  your body and avoid those foods that trigger problems for you. Adding fiber (Citrucel, Metamucil, FiberCon, Flax seed, Miralax) gradually can help thicken stools by absorbing excess fluid and retrain the intestines to act more normally.  Slowly increase the dose over a few weeks.  Too much fiber too soon can backfire and cause cramping & bloating. Probiotics (such as active yogurt, Align, etc) may help repopulate the intestines and colon with normal bacteria and calm down a sensitive digestive tract.  Most studies show it to be of mild help, though, and such products can be costly. Medicines: Bismuth subsalicylate (ex. Kayopectate, Pepto Bismol) every 30 minutes for up to 6 doses can help control diarrhea.  Avoid if pregnant. Loperamide (Immodium) can slow down diarrhea.  Start with two tablets (4mg  total) first and then try one tablet every 6 hours.  Avoid if you are having fevers or severe pain.  If you are not better or start feeling worse, stop all medicines and call your doctor for advice Call your doctor if you are getting worse or not better.  Sometimes further testing (cultures, endoscopy, X-ray studies, bloodwork, etc) may be needed to help diagnose and treat the cause of the diarrhea.  TROUBLESHOOTING IRREGULAR BOWELS 1) Avoid extremes of bowel movements (no bad constipation/diarrhea) 2) Miralax 17gm mixed in 8oz. water or juice-daily. May use BID as needed.  3) Gas-x,Phazyme, etc. as needed for gas & bloating.  4) Soft,bland diet. No spicy,greasy,fried foods.  5) Prilosec over-the-counter as needed  6) May hold gluten/wheat products from diet to see if symptoms improve.  7)  May try probiotics (Align, Activa, etc) to help calm the bowels down 7) If symptoms become worse call back immediately.   TREATMENT OF HEMORRHOID FLARE If these preventive measures fail, you must take action right away! Hemorrhoids are one condition that can be mild in the morning and become intolerable by  nightfall. Most hemorrhoidal flares take several weeks to calm down.  These suggestions can help: Warm soaks.  This helps more than any topical medication.  Use up to 8 times a day.  Usually sitz baths or sitting in a warm bathtub helps.  Sitting on moist warm towels are helpful.  Switching to ice packs/cool compresses can be helpful  Use a Sitz Bath 4-8 times a day for relief A sitz bath is a warm water bath taken in the sitting position that covers only the hips and buttocks. It may be used for either healing or hygiene purposes. Sitz baths are also used to relieve pain, itching, or muscle spasms. The water may contain medicine. Moist heat will help you heal and relax.  HOME CARE INSTRUCTIONS  Take 3 to 4 sitz baths a day. 2. Fill the bathtub half full with warm water. 3. Sit in the water and open the drain a little. 4. Turn on the warm water to keep the tub half full. Keep the water running constantly. 5. Soak in the water for 15 to 20 minutes. 6. After the sitz bath, pat the affected area dry first. SEEK MEDICAL CARE IF:  You get worse instead of better. Stop the sitz baths if you get worse.  Normalize your bowels.  Extremes of diarrhea or constipation will make hemorrhoids worse.  One soft bowel movement a day  is the goal.  Fiber can help get your bowels regular Wet wipes instead of toilet paper Pain control with a NSAID such as ibuprofen (Advil) or naproxen (Aleve) or acetaminophen (Tylenol) around the clock.  Narcotics are constipating and should be minimized if possible Topical creams contain steroids (bydrocortisone) or local anesthetic (xylocaine) can help make pain and itching more tolerable.   EVALUATION If hemorrhoids are still causing problems, you could benefit by an evaluation by a surgeon.  The surgeon will obtain a history and examine you.  If hemorrhoids are diagnosed, some therapies can be offered in the office, usually with an anoscope into the less sensitive area of the  rectum: -injection of hemorrhoids (sclerotherapy) can scar the blood vessels of the swollen/enlarged hemorrhoids to help shrink them down to a more normal size -rubber banding of the enlarged hemorrhoids to help shrink them down to a more normal size -drainage of the blood clot causing a thrombosed hemorrhoid,  to relieve the severe pain   While 90% of the time such problems from hemorrhoids can be managed without preceding to surgery, sometimes the hemorrhoids require a operation to control the problem (uncontrolled bleeding, prolapse, pain, etc.).   This involves being placed under general anesthesia where the surgeon can confirm the diagnosis and remove, suture, or staple the hemorrhoid(s).  Your surgeon can help you treat the problem appropriately.

## 2020-01-21 NOTE — Anesthesia Preprocedure Evaluation (Addendum)
Anesthesia Evaluation  Patient identified by MRN, date of birth, ID band Patient awake    Reviewed: Allergy & Precautions, NPO status , Patient's Chart, lab work & pertinent test results  Airway Mallampati: II  TM Distance: >3 FB Neck ROM: Full    Dental no notable dental hx. (+) Teeth Intact, Dental Advisory Given   Pulmonary neg pulmonary ROS,  Covid positive 12/27/19, now negative   Pulmonary exam normal breath sounds clear to auscultation       Cardiovascular hypertension, Pt. on medications Normal cardiovascular exam Rhythm:Regular Rate:Normal     Neuro/Psych negative neurological ROS  negative psych ROS   GI/Hepatic Neg liver ROS, GERD  ,  Endo/Other  Hypothyroidism   Renal/GU negative Renal ROS  negative genitourinary   Musculoskeletal  (+) Arthritis ,   Abdominal   Peds  Hematology negative hematology ROS (+)   Anesthesia Other Findings   Reproductive/Obstetrics                            Anesthesia Physical Anesthesia Plan  ASA: II  Anesthesia Plan: General   Post-op Pain Management:    Induction: Intravenous  PONV Risk Score and Plan: 3 and Midazolam, Dexamethasone and Ondansetron  Airway Management Planned: Oral ETT  Additional Equipment:   Intra-op Plan:   Post-operative Plan: Extubation in OR  Informed Consent: I have reviewed the patients History and Physical, chart, labs and discussed the procedure including the risks, benefits and alternatives for the proposed anesthesia with the patient or authorized representative who has indicated his/her understanding and acceptance.     Dental advisory given  Plan Discussed with: CRNA  Anesthesia Plan Comments:        Anesthesia Quick Evaluation

## 2020-01-21 NOTE — Anesthesia Procedure Notes (Signed)
Procedure Name: Intubation Date/Time: 01/21/2020 11:46 AM Performed by: Suan Halter, CRNA Pre-anesthesia Checklist: Patient identified, Emergency Drugs available, Suction available and Patient being monitored Patient Re-evaluated:Patient Re-evaluated prior to induction Oxygen Delivery Method: Circle system utilized Preoxygenation: Pre-oxygenation with 100% oxygen Induction Type: IV induction Ventilation: Mask ventilation without difficulty Laryngoscope Size: Mac and 3 Tube type: Oral Tube size: 7.0 mm Number of attempts: 1 Airway Equipment and Method: Stylet and Oral airway Placement Confirmation: ETT inserted through vocal cords under direct vision,  positive ETCO2 and breath sounds checked- equal and bilateral Secured at: 22 cm Tube secured with: Tape Dental Injury: Teeth and Oropharynx as per pre-operative assessment

## 2020-01-21 NOTE — Op Note (Signed)
01/21/2020  12:37 PM  PATIENT:  Cassandra Warren  54 y.o. 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  See operative record  Delay start of Pharmacological VTE agent (>24hrs) due to surgical blood loss or risk of bleeding:  NO  DRAINS: NONE  SPECIMEN:   PERIRECTAL CYST ANAL CANAL POLYP  DISPOSITION OF SPECIMEN:  PATHOLOGY  COUNTS:  YES  PLAN OF CARE: Discharge home after PACU  PATIENT DISPOSITION:  PACU - hemodynamically stable.  INDICATION: Pleasant patient with a perianal mass of concern.  Persistent.  7 for cyst.  Possible fistula.  Also some symptomatic hemorrhoids.  With struggles with hemorrhoids.  Not able to be managed in the office despite an improved bowel regimen.  I recommended examination under anesthesia and surgical treatment:  The anatomy & physiology of the anorectal region was discussed.  The pathophysiology of hemorrhoids and differential diagnosis was discussed.  Natural history risks without surgery was discussed.   I stressed the importance of a bowel regimen to have daily soft bowel movements to minimize progression of disease.  Interventions such as sclerotherapy & banding were discussed.  The patient's symptoms are not adequately controlled by medicines  and other non-operative treatments.  I feel the risks & problems of no surgery outweigh the operative risks; therefore, I recommended surgery to treat the hemorrhoids by ligation, pexy, and possible resection.  Risks such as bleeding, infection, need for further treatment, heart attack, death, and other risks were discussed.   I noted a good likelihood this will help address the problem.  Goals of post-operative recovery were discussed as well.  Possibility that this will not correct all symptoms was explained.  Post-operative pain, bleeding, constipation, urinary difficulties, and other problems after surgery were discussed.  We will work to minimize complications.   Educational handouts further explaining the pathology, treatment options, and bowel regimen were given as well.  Questions were answered.  The patient expresses understanding & wishes to proceed with surgery.  OR FINDINGS: 1 cm ellipsoid perianal cystic mass.  Seemed like simple cyst.  Not classic for a thrombosed hemorrhoid.  No evidence of fistula.  Left lateral perianal region within 1 cm of the anal verge.  Right posterior anal crypt polyp partially prolapsing out.  Excised.  Grade 2 internal hemorrhoids with some inflammation and friability.  Ligation and pexy done.  DESCRIPTION:   Informed consent was confirmed. Patient underwent general anesthesia without difficulty. Patient was placed into prone positioning.  The perianal region was prepped and draped in sterile fashion. Surgical time-out confirmed our plan.  I did digital rectal examination and then transitioned over to anoscopy to get a sense of the anatomy.  Findings noted above.  Patient's area of concern seem consistent with a clear cyst pushing through the skin in the left lateral perianal region.  There is no open draining sinus concerning for  a fistula.  It did not seem like a thrombosed hemorrhoid.  I therefore excised around the cystic mass with a 15 blade through the  dermal tissues and in the subcutaneous tissues and came around it.  I was able to remove and intact with no evidence of any connection to the sphincter complex or the pelvic floor.  Specimen removed.  Patient also had an obvious right posterior anal crypt polyp that was prolapsing out.  Felt that warranted excision.  I proceeded to do hemorrhoidal ligation and pexy.  I used a 2-0 Vicryl suture on a UR-6 needle in a figure-of-eight fashion 6 cm proximal to the anal verge.  I started at the right posterior anal crypt polyp location.  I ran the suture longitudinally distally towards the anal crypt.  I excised the anal crypt polyp in a longitudinal fusiform fashion to good result,, sparing the anal canal to avoid narrowing.  I then ran that stitch longitudinally more distally to close the hemorrhoidectomy wound to the anal verge over a Parks self retaining retractor & occasionally a large Sawyer/Hill-Furgeson retractor to avoid narrowing of the anal canal.  I then tied that stitch down to cause a hemorrhoidopexy.   I did a similar ligation to help close down the left lateral perianal cystic excision as well.  I then did hemorrhoidal ligation and pexy at the other 4 columns.  At the completion of this, all 6 anorectal columns were ligated and pexied in the classic hexagonal fashion (right anterior/lateral/posterior, left anterior/lateral/posterior).  I closed the external part of the hemorrhoidectomy wounds at the right posterior and left lateral perianal cyst wound with interrupted horizontal mattress 3-0 chromic suture, leaving the last 3 mm open to allow natural drainage.    I redid anoscopy & examination.  At completion of this, all hemorrhoids had been removed or reduced into the rectum.  There is no more prolapse.  Internal & external anatomy was more more normal.  Hemostasis was good.  Fluffed gauze was on-laid over the perianal region.  No packing done.  Patient is being extubated go to go to the recovery  room.  I had discussed postop care in detail with the patient in the preop holding area.  Instructions for post-operative recovery and prescriptions are written. I discussed operative findings, updated the patient's status, discussed probable steps to recovery, and gave postoperative recommendations to the patient's spouse.  Recommendations were made.  Questions were answered.  He expressed understanding & appreciation.  Adin Hector, M.D., F.A.C.S. Gastrointestinal and Minimally Invasive Surgery [facility] [address] [place], [place] [address] [phone] Main / Paging

## 2020-01-21 NOTE — Interval H&P Note (Signed)
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  The various methods of treatment have been discussed with the patient and family. After consideration of risks, benefits and other options for treatment, the patient has consented to  Procedure(s): ANAL EXAM UNDER ANESTHESIA (N/A) EXCISION PERIANAL MASS, POSSIBLE REPAIR OF PERIRECTAL FISTULA, POSSIBLE HEMORRHOID LIGATION/PEXY (N/A) as a surgical intervention.  The patient's history has been reviewed, patient examined, no change in status, stable for surgery.  I have reviewed the patient's chart and labs.  Questions were answered to the patient's satisfaction.    I have re-reviewed the the patient's records, history, medications, and allergies.  I have re-examined the patient.  I again discussed intraoperative plans and goals of post-operative recovery.  The patient agrees to proceed.  Cassandra Warren  1966-03-09 [id number]  Patient Care Team: [name], [place] as PCP - General (Physician Assistant) Obgyn, Erling Conte as Consulting Physician (Obstetrics and Gynecology)  Patient Active Problem List   Diagnosis Date Noted   Kidney lesion 09/29/2017   Essential hypertension 05/30/2017   Hypothyroidism 04/12/2017   Low serum ferritin level 04/12/2017   Overweight (BMI 25.0-29.9) 04/12/2017    Past Medical History:  Diagnosis Date   Anemia    Arthritis    back   Diverticulosis 08/2019   Noted on Colonoscopy   GERD (gastroesophageal reflux disease)    History of colon polyps 08/2019   History of COVID-19    tested positive 3/1 no symptoms restested a couple of days later negative results x2   Hypertension    Kidney lesion    Mass of perirectal soft tissue    Thyroid disease     Past Surgical History:  Procedure Laterality Date   BREAST CYST ASPIRATION Left    CESAREAN SECTION     COLONOSCOPY  09/10/2019   OOPHORECTOMY     left ovary only   WISDOM TOOTH EXTRACTION      Social History   Socioeconomic History   Marital status: Married    Spouse name: Not on file   Number of children: Not on file   Years of education: Not on file   Highest education level: Not on file  Occupational History   Not on file  Tobacco Use   Smoking status: Never Smoker   Smokeless tobacco: Never Used  Substance and Sexual Activity   Alcohol use: Yes    Comment: occ   Drug use: No   Sexual activity: Yes    Birth control/protection: None  Other Topics Concern   Not on file  Social History Narrative   Not on file   Social Determinants of Health   Financial Resource Strain:    Difficulty of Paying Living Expenses:   Food Insecurity:    Worried About Charity fundraiser in the Last Year:    Arboriculturist in the Last Year:   Transportation Needs:    Film/video editor (Medical):    Lack of Transportation (Non-Medical):   Physical Activity:    Days of Exercise per Week:    Minutes of Exercise per Session:   Stress:    Feeling of Stress :   Social Connections:    Frequency of Communication with Friends and Family:    Frequency of Social Gatherings with Friends and Family:    Attends Religious Services:    Active Member of  Clubs or Organizations:    Attends Music therapist:    Marital Status:   Intimate Partner Violence:    Fear of Current or Ex-Partner:    Emotionally Abused:    Physically Abused:    Sexually Abused:     Family History  Problem Relation Age of Onset   Diabetes Mother    Heart disease Father    Mental illness Brother    Breast cancer Maternal Aunt     Medications Prior to Admission  Medication Sig Dispense Refill Last Dose   lisinopril (ZESTRIL) 10 MG tablet Take 1 tablet (10 mg total) by mouth daily. 90 tablet 1 01/20/2020 at Unknown time   SYNTHROID 50 MCG tablet TAKE 1 TABLET IN THE MORNING ON AN EMPTY STOMACH. 30  tablet 0 01/21/2020 at 0645   famotidine (PEPCID) 20 MG tablet Take 20 mg by mouth as needed for heartburn or indigestion.   More than a month at Unknown time    Current Facility-Administered Medications  Medication Dose Route Frequency Provider Last Rate Last Admin   bupivacaine liposome (EXPAREL) 1.3 % injection 266 mg  20 mL Infiltration Once Michael Boston, MD       Chlorhexidine Gluconate Cloth 2 % PADS 6 each  6 each Topical Once Michael Boston, MD       And   Chlorhexidine Gluconate Cloth 2 % PADS 6 each  6 each Topical Once Geselle Cardosa, Remo Lipps, MD       clindamycin (CLEOCIN) IVPB 900 mg  900 mg Intravenous On Call to OR Michael Boston, MD       And   gentamicin (GARAMYCIN) 340 mg in dextrose 5 % 100 mL IVPB  5 mg/kg Intravenous On Call to OR Michael Boston, MD       lactated ringers infusion   Intravenous Continuous Lyn Hollingshead, MD 50 mL/hr at 01/21/20 0935 50 mL/hr at 01/21/20 0935     Allergies  Allergen Reactions   Erythromycin Other (See Comments)    Hands and feet tingling   Morphine And Related Rash   Penicillin G Palpitations    BP (!) 144/90   Pulse 79   Temp 98 F (36.7 C) (Oral)   Resp 14   Ht 5\' 3"  (1.6 m)   Wt 68.9 kg   LMP 01/04/2020   SpO2 100%   BMI 26.89 kg/m   Labs: Results for orders placed or performed during the hospital encounter of 01/21/20 (from the past 48 hour(s))  I-STAT, chem 8     Status: None   Collection Time: 01/21/20  9:26 AM  Result Value Ref Range   Sodium 141 135 - 145 mmol/L   Potassium 4.2 3.5 - 5.1 mmol/L   Chloride 109 98 - 111 mmol/L   BUN 13 6 - 20 mg/dL   Creatinine, Ser 0.90 0.44 - 1.00 mg/dL   Glucose, Bld 86 70 - 99 mg/dL    Comment: Glucose reference range applies only to samples taken after fasting for at least 8 hours.   Calcium, Ion 1.18 1.15 - 1.40 mmol/L   TCO2 24 22 - 32 mmol/L   Hemoglobin 15.0 12.0 - 15.0 g/dL   HCT 44.0 36.0 - 46.0 %    Imaging / Studies: No results found.   Adin Hector, M.D.,  F.A.C.S. Gastrointestinal and Minimally Invasive Surgery [facility] [address], [place], [place] [address] [phone] Main / Paging  01/21/2020 10:17 AM    Revonda Standard  Johney Maine

## 2020-01-21 NOTE — Transfer of Care (Signed)
Immediate Anesthesia Transfer of Care Note  Patient: Elesia Norrick  Procedure(s) Performed: Procedure(s) (LRB): ANAL EXAM UNDER ANESTHESIA (N/A) EXCISION PERIANAL MASS, HEMORRHOID LIGATION/PEXY (N/A)  Patient Location: PACU  Anesthesia Type: General  Level of Consciousness: awake, oriented, sedated and patient cooperative  Airway & Oxygen Therapy: Patient Spontanous Breathing and Patient connected to face mask oxygen  Post-op Assessment: Report given to PACU RN and Post -op Vital signs reviewed and stable  Post vital signs: Reviewed and stable  Complications: No apparent anesthesia complications Last Vitals:  Vitals Value Taken Time  BP    Temp    Pulse    Resp    SpO2      Last Pain:  Vitals:   01/21/20 0945  TempSrc: Oral  PainSc: 0-No pain      Patients Stated Pain Goal: 6 (01/21/20 0945)

## 2020-01-25 LAB — SURGICAL PATHOLOGY

## 2020-01-25 NOTE — Anesthesia Postprocedure Evaluation (Signed)
Anesthesia Post Note  Patient: Cassandra Warren  Procedure(s) Performed: ANAL EXAM UNDER ANESTHESIA (N/A Rectum) EXCISION PERIRECTAL CYST, EXCISION OF ANAL CANAL POLYP, HEMORRHOID LIGATION/PEXY (N/A Rectum)     Patient location during evaluation: PACU Anesthesia Type: General Level of consciousness: awake and alert Pain management: pain level controlled Vital Signs Assessment: post-procedure vital signs reviewed and stable Respiratory status: spontaneous breathing, nonlabored ventilation, respiratory function stable and patient connected to nasal cannula oxygen Cardiovascular status: blood pressure returned to baseline and stable Postop Assessment: no apparent nausea or vomiting Anesthetic complications: no    Last Vitals:  Vitals:   01/21/20 1425 01/21/20 1545  BP:  133/80  Pulse: 79 76  Resp: 17 14  Temp:    SpO2: 100% 100%    Last Pain:  Vitals:   01/21/20 1515  TempSrc:   PainSc: 3    Pain Goal: Patients Stated Pain Goal: 6 (01/21/20 0945)                 Chelsey L Woodrum

## 2020-01-26 ENCOUNTER — Other Ambulatory Visit: Payer: Self-pay | Admitting: *Deleted

## 2020-01-26 MED ORDER — LISINOPRIL 10 MG PO TABS: 10.0000 mg | ORAL_TABLET | Freq: Every day | ORAL | 0 refills | Status: DC

## 2020-02-11 LAB — HM MAMMOGRAPHY

## 2020-02-14 ENCOUNTER — Encounter: Payer: Self-pay | Admitting: Family Medicine

## 2020-02-14 ENCOUNTER — Other Ambulatory Visit: Payer: Self-pay | Admitting: Obstetrics and Gynecology

## 2020-02-14 DIAGNOSIS — N6320 Unspecified lump in the left breast, unspecified quadrant: Secondary | ICD-10-CM

## 2020-02-14 LAB — CBC: RBC: 4.4 (ref 3.87–5.11)

## 2020-02-14 LAB — HEMOGLOBIN A1C: Hemoglobin A1C: 5.3

## 2020-02-14 LAB — BASIC METABOLIC PANEL
BUN: 13 (ref 4–21)
CO2: 22 (ref 13–22)
Chloride: 106 (ref 99–108)
Creatinine: 1 (ref 0.5–1.1)
Glucose: 87
Potassium: 4.2 (ref 3.4–5.3)
Sodium: 142 (ref 137–147)

## 2020-02-14 LAB — LIPID PANEL
Cholesterol: 195 (ref 0–200)
HDL: 37 (ref 35–70)
LDL Cholesterol: 122
Triglycerides: 206 — AB (ref 40–160)

## 2020-02-14 LAB — HEPATIC FUNCTION PANEL
ALT: 11 (ref 7–35)
AST: 17 (ref 13–35)
Alkaline Phosphatase: 80 (ref 25–125)
Bilirubin, Total: 0.5

## 2020-02-14 LAB — COMPREHENSIVE METABOLIC PANEL
Albumin: 4.2 (ref 3.5–5.0)
Calcium: 8.9 (ref 8.7–10.7)
GFR calc non Af Amer: 62
Globulin: 2.7

## 2020-02-14 LAB — CBC AND DIFFERENTIAL
HCT: 39 (ref 36–46)
Hemoglobin: 13.2 (ref 12.0–16.0)
WBC: 7

## 2020-02-14 LAB — VITAMIN D 25 HYDROXY (VIT D DEFICIENCY, FRACTURES): Vit D, 25-Hydroxy: 45.1

## 2020-02-14 LAB — TSH: TSH: 2.75 (ref 0.41–5.90)

## 2020-02-15 ENCOUNTER — Encounter: Payer: Self-pay | Admitting: Physician Assistant

## 2020-02-16 ENCOUNTER — Ambulatory Visit
Admission: RE | Admit: 2020-02-16 | Discharge: 2020-02-16 | Disposition: A | Discharge status: Home / Self Care | Payer: Commercial Managed Care - PPO | Source: Ambulatory Visit | Attending: Obstetrics and Gynecology | Admitting: Obstetrics and Gynecology

## 2020-02-16 ENCOUNTER — Other Ambulatory Visit: Payer: Self-pay

## 2020-02-16 DIAGNOSIS — N6320 Unspecified lump in the left breast, unspecified quadrant: Secondary | ICD-10-CM

## 2020-02-22 ENCOUNTER — Encounter: Payer: Self-pay | Admitting: Physician Assistant

## 2020-02-22 LAB — T4, FREE: T4,Free (Direct): 1.44

## 2020-02-23 ENCOUNTER — Other Ambulatory Visit: Payer: Commercial Managed Care - PPO

## 2020-06-20 ENCOUNTER — Telehealth (INDEPENDENT_AMBULATORY_CARE_PROVIDER_SITE_OTHER): Payer: Commercial Managed Care - PPO | Admitting: Physician Assistant

## 2020-06-20 ENCOUNTER — Other Ambulatory Visit: Payer: Self-pay

## 2020-06-20 ENCOUNTER — Encounter: Payer: Self-pay | Admitting: Physician Assistant

## 2020-06-20 VITALS — Ht 63.0 in | Wt 153.0 lb

## 2020-06-20 DIAGNOSIS — K2100 Gastro-esophageal reflux disease with esophagitis, without bleeding: Secondary | ICD-10-CM

## 2020-06-20 DIAGNOSIS — I10 Essential (primary) hypertension: Secondary | ICD-10-CM

## 2020-06-20 MED ORDER — LISINOPRIL 10 MG PO TABS: 10.0000 mg | ORAL_TABLET | Freq: Every day | ORAL | 3 refills | Status: DC

## 2020-06-20 MED ORDER — FAMOTIDINE 20 MG PO TABS: 20.0000 mg | ORAL_TABLET | ORAL | 3 refills | Status: DC | PRN

## 2020-06-20 NOTE — Progress Notes (Signed)
Virtual Visit via Video   I connected with Cassandra Warren on 06/20/20 at 12:00 PM EDT by a video enabled telemedicine application and verified that I am speaking with the correct person using two identifiers. Location patient: Home Location provider: [facility], Office Persons participating in the virtual visit: Skie Vitrano, [name] PA-C, Anselmo Pickler, LPN   I discussed the limitations of evaluation and management by telemedicine and the availability of in person appointments. The patient expressed understanding and agreed to proceed.  I acted as a Education administrator for [employer], PA-C [employer], LPN   Subjective:   HPI:   Hypertension Pt needs a refill on Lisinopril 10 mg daily, tolerating well. Pt states blood pressure has been good. Pt denies headaches, dizziness, blurred vision, chest pain, SOB or lower leg edema. Denies excessive caffeine intake, stimulant usage, excessive alcohol intake or increase in salt consumption.  Wt Readings from Last 5 Encounters:  06/20/20 153 lb (69.4 kg)  01/21/20 151 lb 12.8 oz (68.9 kg)  11/26/19 155 lb 3.2 oz (70.4 kg)  09/10/19 152 lb (68.9 kg)  08/27/19 152 lb (68.9 kg)   GERD Uncontrolled. Was given pepcid when she saw Dr. Havery Moros earlier this year and did well with this. When she has dietary indiscretion she struggles with her heartburn. She would like to resume this. Denies: rectal bleeding, chest pain, SOB, nausea   ROS: See pertinent positives and negatives per HPI.  Patient Active Problem List   Diagnosis Date Noted  . 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 Prolapsed internal hemorrhoids, grade 3 01/21/2020  . Kidney lesion 09/29/2017  . Essential hypertension 05/30/2017  . Hypothyroidism 04/12/2017  . Low serum ferritin level 04/12/2017  . Overweight (BMI 25.0-29.9) 04/12/2017    Social History   Tobacco Use  . Smoking status: Never Smoker  . Smokeless tobacco: Never Used  Substance Use  Topics  . Alcohol use: Yes    Comment: occ    Current Outpatient Medications:  .  famotidine (PEPCID) 20 MG tablet, Take 1 tablet (20 mg total) by mouth as needed for heartburn or indigestion., Disp: 90 tablet, Rfl: 3 .  lisinopril (ZESTRIL) 10 MG tablet, Take 1 tablet (10 mg total) by mouth daily., Disp: 90 tablet, Rfl: 3 .  SYNTHROID 50 MCG tablet, TAKE 1 TABLET IN THE MORNING ON AN EMPTY STOMACH., Disp: 30 tablet, Rfl: 0  Allergies  Allergen Reactions  . Erythromycin Other (See Comments)    Hands and feet tingling  . Morphine And Related Rash  . Penicillin G Palpitations    Objective:   VITALS: Per patient if applicable, see vitals. GENERAL: Alert, appears well and in no acute distress. HEENT: Atraumatic, conjunctiva clear, no obvious abnormalities on inspection of external nose and ears. NECK: Normal movements of the head and neck. CARDIOPULMONARY: No increased WOB. Speaking in clear sentences. I:E ratio WNL.  MS: Moves all visible extremities without noticeable abnormality. PSYCH: Pleasant and cooperative, well-groomed. Speech normal rate and rhythm. Affect is appropriate. Insight and judgement are appropriate. Attention is focused, linear, and appropriate.  NEURO: CN grossly intact. Oriented as arrived to appointment on time with no prompting. Moves both UE equally.  SKIN: No obvious lesions, wounds, erythema, or cyanosis noted on face or hands.  Assessment and Plan:   Cassandra Warren was seen today for medication refill.  Diagnoses and all orders for this visit:  Essential hypertension Well controlled.  Continue Lisinopril 10 mg daily. Follow-up in 1 year, sooner  if concerns.  Gastroesophageal reflux disease with esophagitis without hemorrhage Uncontrolled. Refill pepcid -- recommend taking 40 mg daily x 2 weeks and then 20 mg daily.  If worsening, will refer back to [facility].  Other orders -     lisinopril (ZESTRIL) 10 MG tablet; Take 1 tablet (10 mg total) by mouth  daily. -     famotidine (PEPCID) 20 MG tablet; Take 1 tablet (20 mg total) by mouth as needed for heartburn or indigestion.   . Reviewed expectations re: course of current medical issues. . Discussed self-management of symptoms. . Outlined signs and symptoms indicating need for more acute intervention. . Patient verbalized understanding and all questions were answered. Marland Kitchen Health Maintenance issues including appropriate healthy diet, exercise, and smoking avoidance were discussed with patient. . See orders for this visit as documented in the electronic medical record.  I discussed the assessment and treatment plan with the patient. The patient was provided an opportunity to ask questions and all were answered. The patient agreed with the plan and demonstrated an understanding of the instructions.   The patient was advised to call back or seek an in-person evaluation if the symptoms worsen or if the condition fails to improve as anticipated.   CMA or LPN served as scribe during this visit. History, Physical, and Plan performed by medical provider. The above documentation has been reviewed and is accurate and complete.   [place], [place] 06/20/2020

## 2020-07-26 ENCOUNTER — Encounter: Payer: Self-pay | Admitting: Physician Assistant

## 2020-07-28 ENCOUNTER — Ambulatory Visit: Payer: Commercial Managed Care - PPO | Admitting: Physician Assistant

## 2020-07-28 ENCOUNTER — Ambulatory Visit (INDEPENDENT_AMBULATORY_CARE_PROVIDER_SITE_OTHER): Payer: Commercial Managed Care - PPO | Admitting: Physician Assistant

## 2020-07-28 ENCOUNTER — Other Ambulatory Visit: Payer: Self-pay

## 2020-07-28 ENCOUNTER — Encounter: Payer: Self-pay | Admitting: Physician Assistant

## 2020-07-28 VITALS — BP 128/80 | HR 75 | Temp 97.8°F | Ht 63.0 in | Wt 156.0 lb

## 2020-07-28 DIAGNOSIS — H9201 Otalgia, right ear: Secondary | ICD-10-CM

## 2020-07-28 NOTE — Patient Instructions (Signed)
It was great to see you!  Complete the antibiotic.  I think you have lingering eustachian tube dysfunction.  Start flonase twice daily in both nostrils to get that ear drainage free.   Eustachian Tube Dysfunction  Eustachian tube dysfunction refers to a condition in which a blockage develops in the narrow passage that connects the middle ear to the back of the nose (eustachian tube). The eustachian tube regulates air pressure in the middle ear by letting air move between the ear and nose. It also helps to drain fluid from the middle ear space. Eustachian tube dysfunction can affect one or both ears. When the eustachian tube does not function properly, air pressure, fluid, or both can build up in the middle ear. What are the causes? This condition occurs when the eustachian tube becomes blocked or cannot open normally. Common causes of this condition include:  Ear infections.  Colds and other infections that affect the nose, mouth, and throat (upper respiratory tract).  Allergies.  Irritation from cigarette smoke.  Irritation from stomach acid coming up into the esophagus (gastroesophageal reflux). The esophagus is the tube that carries food from the mouth to the stomach.  Sudden changes in air pressure, such as from descending in an airplane or scuba diving.  Abnormal growths in the nose or throat, such as: ? Growths that line the nose (nasal polyps). ? Abnormal growth of cells (tumors). ? Enlarged tissue at the back of the throat (adenoids). What increases the risk? You are more likely to develop this condition if:  You smoke.  You are overweight.  You are a child who has: ? Certain birth defects of the mouth, such as cleft palate. ? Large tonsils or adenoids. What are the signs or symptoms? Common symptoms of this condition include:  A feeling of fullness in the ear.  Ear pain.  Clicking or popping noises in the ear.  Ringing in the ear.  Hearing loss.  Loss  of balance.  Dizziness. Symptoms may get worse when the air pressure around you changes, such as when you travel to an area of high elevation, fly on an airplane, or go scuba diving. How is this diagnosed? This condition may be diagnosed based on:  Your symptoms.  A physical exam of your ears, nose, and throat.  Tests, such as those that measure: ? The movement of your eardrum (tympanogram). ? Your hearing (audiometry). How is this treated? Treatment depends on the cause and severity of your condition.  In mild cases, you may relieve your symptoms by moving air into your ears. This is called "popping the ears."  In more severe cases, or if you have symptoms of fluid in your ears, treatment may include: ? Medicines to relieve congestion (decongestants). ? Medicines that treat allergies (antihistamines). ? Nasal sprays or ear drops that contain medicines that reduce swelling (steroids). ? A procedure to drain the fluid in your eardrum (myringotomy). In this procedure, a small tube is placed in the eardrum to:  Drain the fluid.  Restore the air in the middle ear space. ? A procedure to insert a balloon device through the nose to inflate the opening of the eustachian tube (balloon dilation). Follow these instructions at home: Lifestyle  Do not do any of the following until your health care provider approves: ? Travel to high altitudes. ? Fly in airplanes. ? Work in a Pension scheme manager or room. ? Scuba dive.  Do not use any products that contain nicotine or tobacco, such as  cigarettes and e-cigarettes. If you need help quitting, ask your health care provider.  Keep your ears dry. Wear fitted earplugs during showering and bathing. Dry your ears completely after. General instructions  Take over-the-counter and prescription medicines only as told by your health care provider.  Use techniques to help pop your ears as recommended by your health care provider. These may  include: ? Chewing gum. ? Yawning. ? Frequent, forceful swallowing. ? Closing your mouth, holding your nose closed, and gently blowing as if you are trying to blow air out of your nose.  Keep all follow-up visits as told by your health care provider. This is important. Contact a health care provider if:  Your symptoms do not go away after treatment.  Your symptoms come back after treatment.  You are unable to pop your ears.  You have: ? A fever. ? Pain in your ear. ? Pain in your head or neck. ? Fluid draining from your ear.  Your hearing suddenly changes.  You become very dizzy.  You lose your balance. Summary  Eustachian tube dysfunction refers to a condition in which a blockage develops in the eustachian tube.  It can be caused by ear infections, allergies, inhaled irritants, or abnormal growths in the nose or throat.  Symptoms include ear pain, hearing loss, or ringing in the ears.  Mild cases are treated with maneuvers to unblock the ears, such as yawning or ear popping.  Severe cases are treated with medicines. Surgery may also be done (rare). This information is not intended to replace advice given to you by your health care provider. Make sure you discuss any questions you have with your health care provider. Document Revised: 02/03/2018 Document Reviewed: 02/03/2018 Elsevier Patient Education  [place].
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
## 2020-10-03 ENCOUNTER — Other Ambulatory Visit: Payer: Self-pay | Admitting: Obstetrics and Gynecology

## 2020-10-03 DIAGNOSIS — Z1231 Encounter for screening mammogram for malignant neoplasm of breast: Secondary | ICD-10-CM

## 2020-10-10 ENCOUNTER — Other Ambulatory Visit: Payer: Commercial Managed Care - PPO

## 2020-10-10 DIAGNOSIS — Z20822 Contact with and (suspected) exposure to covid-19: Secondary | ICD-10-CM

## 2020-10-11 ENCOUNTER — Encounter: Payer: Self-pay | Admitting: Physician Assistant

## 2020-10-11 LAB — SARS-COV-2, NAA 2 DAY TAT

## 2020-10-11 LAB — NOVEL CORONAVIRUS, NAA: SARS-CoV-2, NAA: DETECTED — AB

## 2020-10-12 ENCOUNTER — Encounter: Payer: Self-pay | Admitting: Physician Assistant

## 2020-10-16 ENCOUNTER — Other Ambulatory Visit: Payer: Self-pay | Admitting: Obstetrics and Gynecology

## 2020-10-16 DIAGNOSIS — R5381 Other malaise: Secondary | ICD-10-CM

## 2020-11-09 ENCOUNTER — Other Ambulatory Visit: Payer: Self-pay | Admitting: Obstetrics and Gynecology

## 2020-11-09 DIAGNOSIS — N644 Mastodynia: Secondary | ICD-10-CM

## 2021-01-17 LAB — CBC AND DIFFERENTIAL
HCT: 35 — AB (ref 36–46)
Hemoglobin: 10.8 — AB (ref 12.0–16.0)
Neutrophils Absolute: 4.2
Platelets: 266 (ref 150–399)
WBC: 7.1

## 2021-01-17 LAB — VITAMIN B12: Vitamin B-12: 614

## 2021-01-17 LAB — BASIC METABOLIC PANEL
BUN: 11 (ref 4–21)
CO2: 21 (ref 13–22)
Chloride: 105 (ref 99–108)
Creatinine: 1 (ref 0.5–1.1)
Glucose: 93
Potassium: 4.2 (ref 3.4–5.3)
Sodium: 141 (ref 137–147)

## 2021-01-17 LAB — HEPATIC FUNCTION PANEL
ALT: 13 (ref 7–35)
AST: 17 (ref 13–35)
Alkaline Phosphatase: 87 (ref 25–125)
Bilirubin, Total: 0.3

## 2021-01-17 LAB — COMPREHENSIVE METABOLIC PANEL
Albumin: 4 (ref 3.5–5.0)
Calcium: 9 (ref 8.7–10.7)
GFR calc non Af Amer: 65
Globulin: 3.1

## 2021-01-17 LAB — HEMOGLOBIN A1C: Hemoglobin A1C: 5.7

## 2021-01-17 LAB — LIPID PANEL
Cholesterol: 214 — AB (ref 0–200)
HDL: 44 (ref 35–70)
LDL Cholesterol: 147
Triglycerides: 130 (ref 40–160)

## 2021-01-17 LAB — CBC: RBC: 4.26 (ref 3.87–5.11)

## 2021-01-17 LAB — TSH: TSH: 3.56 (ref 0.41–5.90)

## 2021-01-17 LAB — VITAMIN D 25 HYDROXY (VIT D DEFICIENCY, FRACTURES): Vit D, 25-Hydroxy: 45

## 2021-01-22 ENCOUNTER — Encounter: Payer: Self-pay | Admitting: Physician Assistant

## 2021-01-22 LAB — FERRITIN: Ferritin: 8

## 2021-01-22 LAB — IRON: Iron: 21

## 2021-01-31 ENCOUNTER — Ambulatory Visit (INDEPENDENT_AMBULATORY_CARE_PROVIDER_SITE_OTHER): Payer: Commercial Managed Care - PPO | Admitting: Family Medicine

## 2021-01-31 ENCOUNTER — Ambulatory Visit (INDEPENDENT_AMBULATORY_CARE_PROVIDER_SITE_OTHER): Payer: Commercial Managed Care - PPO

## 2021-01-31 ENCOUNTER — Encounter: Payer: Self-pay | Admitting: Family Medicine

## 2021-01-31 ENCOUNTER — Encounter: Payer: Self-pay | Admitting: Nurse Practitioner

## 2021-01-31 ENCOUNTER — Other Ambulatory Visit: Payer: Self-pay

## 2021-01-31 VITALS — BP 130/82 | HR 86 | Temp 98.6°F | Wt 153.4 lb

## 2021-01-31 DIAGNOSIS — D500 Iron deficiency anemia secondary to blood loss (chronic): Secondary | ICD-10-CM

## 2021-01-31 DIAGNOSIS — E039 Hypothyroidism, unspecified: Secondary | ICD-10-CM | POA: Diagnosis not present

## 2021-01-31 DIAGNOSIS — R221 Localized swelling, mass and lump, neck: Secondary | ICD-10-CM | POA: Diagnosis not present

## 2021-01-31 NOTE — Patient Instructions (Signed)
Please return in 6 months for your annual complete physical; please come fasting with Aldona Bar  We will get you scheduled for an ultrasound of your neck.    If you have any questions or concerns, please don't hesitate to send me a message via MyChart or call the office at [phone]. Thank you for visiting with [place] today! It's our pleasure caring for you.

## 2021-01-31 NOTE — Progress Notes (Signed)
Subjective  CC:  Chief Complaint  Patient presents with  . Thyroid Nodule    Increase and decrease in size, seen by OB/GYN today - wanted to be seen ASAP by primary care     HPI: Cassandra Warren is a 55 y.o. female who presents to the office today to address the problems listed above in the chief complaint.  55 year old female with hypothyroidism complains of left-sided neck mass.  She reports that she had Covid back in December.  Mild case.  Supportive care only.  She was fully vaccinated.  Since, she has noted a neck mass on the left side of her neck.  She sees at times in the mirror.  She feels that it can move when she swallows.  She denies tenderness, redness, sore throat, dysphagia, odynophagia, problems breathing.  She was seen by her gynecologist today who was concerned and thus she is here today for same-day visit.  She denies neck pain.  She is very worried that it could be related to thyroid or a cancer.  She denies systemic symptoms including fevers, chills, changes in weight.  She recently had a full blood panel done and I reviewed that.  See below.  She has a mild anemia.  TSH has been normal in the last several years.  She menstruates.  No visits with results within 1 Day(s) from this visit.  Latest known visit with results is:  Abstract on 01/22/2021  Component Date Value Ref Range Status  . Hemoglobin 01/17/2021 10.8* 12.0 - 16.0 Final  . HCT 01/17/2021 35* 36 - 46 Final  . Neutrophils Absolute 01/17/2021 4.20   Final  . Platelets 01/17/2021 266  150 - 399 Final  . WBC 01/17/2021 7.1   Final  . RBC 01/17/2021 4.26  3.87 - 5.11 Final  . Vit D, 25-Hydroxy 01/17/2021 45   Final  . Glucose 01/17/2021 93   Final  . BUN 01/17/2021 11  4 - 21 Final  . CO2 01/17/2021 21  13 - 22 Final  . Creatinine 01/17/2021 1.0  0.5 - 1.1 Final  . Potassium 01/17/2021 4.2  3.4 - 5.3 Final  . Sodium 01/17/2021 141  137 - 147 Final  . Chloride 01/17/2021 105  99 - 108 Final  . Globulin  01/17/2021 3.1   Final  . GFR calc non Af Amer 01/17/2021 65   Final  . Calcium 01/17/2021 9.0  8.7 - 10.7 Final  . Albumin 01/17/2021 4.0  3.5 - 5.0 Final  . Triglycerides 01/17/2021 130  40 - 160 Final  . Cholesterol 01/17/2021 214* 0 - 200 Final  . HDL 01/17/2021 44  35 - 70 Final  . LDL Cholesterol 01/17/2021 147   Final  . Alkaline Phosphatase 01/17/2021 87  25 - 125 Final  . ALT 01/17/2021 13  7 - 35 Final  . AST 01/17/2021 17  13 - 35 Final  . Bilirubin, Total 01/17/2021 0.3   Final  . Vitamin B-12 01/17/2021 614   Final  . Hemoglobin A1C 01/17/2021 5.7   Final  . TSH 01/17/2021 3.56  0.41 - 5.90 Final  . Iron 01/17/2021 21   Final  . Ferritin 01/17/2021 8   Final    Assessment  1. Neck mass   2. Acquired hypothyroidism   3. Iron deficiency anemia due to chronic blood loss      Plan   Subjective neck mass: My exam today is normal.  She seems to be palpating the greater  horn of the hyoid bone.  I do not feel an enlarged thyroid.  There is no redness or lymphadenopathy.  Because of her concern, will check x-rays to ensure the bone appears normal and the neck ultrasound to ensure no masses are noted.  I explained my findings.  Patient seems reassured.  We will follow up after testing is done.  Discussed iron deficiency anemia likely due to menstrual pattern.  Start iron daily and recheck in 8 to 12 weeks.  I recommend an annual physical with Cassandra Warren.  She will schedule  Follow up: 6 months with complete physical Visit date not found  Orders Placed This Encounter  Procedures  . DG Cervical Spine 2 or 3 views  . US Soft Tissue Head/Neck (NON-THYROID)   No orders of the defined types were placed in this encounter.     I reviewed the patients updated PMH, FH, and SocHx.    Patient Active Problem List   Diagnosis Date Noted  . 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 Prolapsed internal hemorrhoids, grade 3 01/21/2020  . Kidney lesion 09/29/2017  . Essential  hypertension 05/30/2017  . Hypothyroidism 04/12/2017  . Low serum ferritin level 04/12/2017  . Overweight (BMI 25.0-29.9) 04/12/2017   Current Meds  Medication Sig  . famotidine (PEPCID) 20 MG tablet Take 1 tablet (20 mg total) by mouth as needed for heartburn or indigestion.  Marland Kitchen lisinopril (ZESTRIL) 10 MG tablet Take 1 tablet (10 mg total) by mouth daily.  Marland Kitchen SYNTHROID 50 MCG tablet TAKE 1 TABLET IN THE MORNING ON AN EMPTY STOMACH.    Allergies: Patient is allergic to erythromycin, morphine and related, and penicillin g. Family History: Patient family history includes Breast cancer in her maternal aunt; Diabetes in her mother; Heart disease in her father; Mental illness in her brother. Social History:  Patient  reports that she has never smoked. She has never used smokeless tobacco. She reports current alcohol use. She reports that she does not use drugs.  Review of Systems: Constitutional: Negative for fever malaise or anorexia Cardiovascular: negative for chest pain Respiratory: negative for SOB or persistent cough Gastrointestinal: negative for abdominal pain  Objective  Vitals: BP 130/82   Pulse 86   Temp 98.6 F (37 C) (Temporal)   Wt 153 lb 6.4 oz (69.6 kg)   SpO2 99%   BMI 27.17 kg/m  General: no acute distress , A&Ox3, appears anxious HEENT: PEERL, conjunctiva normal, neck is supple, no palpable neck masses, no cervical lymphadenopathy anterior or posterior, no thyromegaly, oropharynx is clear, no sublingual masses palpated Cardiovascular:  RRR without murmur or gallop.  Respiratory:  Good breath sounds bilaterally, CTAB with normal respiratory effort Skin:  Warm, no rashes     Commons side effects, risks, benefits, and alternatives for medications and treatment plan prescribed today were discussed, and the patient expressed understanding of the given instructions. Patient is instructed to call or message via MyChart if he/she has any questions or concerns regarding  our treatment plan. No barriers to understanding were identified. We discussed Red Flag symptoms and signs in detail. Patient expressed understanding regarding what to do in case of urgent or emergency type symptoms.   Medication list was reconciled, printed and provided to the patient in AVS. Patient instructions and summary information was reviewed with the patient as documented in the AVS. This note was prepared with assistance of Dragon voice recognition software. Occasional wrong-word or sound-a-like substitutions may have occurred due to the inherent limitations of  voice recognition software  This visit occurred during the SARS-CoV-2 public health emergency.  Safety protocols were in place, including screening questions prior to the visit, additional usage of staff PPE, and extensive cleaning of exam room while observing appropriate contact time as indicated for disinfecting solutions.

## 2021-02-02 ENCOUNTER — Ambulatory Visit
Admission: RE | Admit: 2021-02-02 | Discharge: 2021-02-02 | Disposition: A | Discharge status: Home / Self Care | Payer: Commercial Managed Care - PPO | Source: Ambulatory Visit | Attending: Family Medicine | Admitting: Family Medicine

## 2021-02-02 ENCOUNTER — Other Ambulatory Visit: Payer: Self-pay

## 2021-02-02 ENCOUNTER — Telehealth: Payer: Self-pay | Admitting: *Deleted

## 2021-02-02 DIAGNOSIS — R221 Localized swelling, mass and lump, neck: Secondary | ICD-10-CM

## 2021-02-02 NOTE — Progress Notes (Signed)
Please call patient: I have reviewed his/her lab results. Please let her know that her ultrasound does show a mass in the muscle near the hyoid bone! The thyroid is fine; please order a neck CT scan with and without IV contrast to further delineate and define the mass.  I will send Sam the findings as well.  We will let her know the next step once the CT scan report is done. Thanks!

## 2021-02-02 NOTE — Telephone Encounter (Signed)
Pt called back, told her results per Dr. Jonni Sanger, ultrasound does show a mass in the muscle near the hyoid bone! The thyroid is fine; please order a neck CT scan with and without IV contrast to further delineate and define the mass. Told her someone will be contacting her to schedule scan. Pt verbalized understanding. I will send Sam the findings as well.  We will let her know the next step once the CT scan report is done. Thanks! Pt verbalized understanding.

## 2021-02-05 ENCOUNTER — Telehealth: Payer: Self-pay

## 2021-02-05 ENCOUNTER — Other Ambulatory Visit: Payer: Self-pay

## 2021-02-05 DIAGNOSIS — R221 Localized swelling, mass and lump, neck: Secondary | ICD-10-CM

## 2021-02-05 NOTE — Telephone Encounter (Signed)
Left message on voicemail to call office.  

## 2021-02-05 NOTE — Telephone Encounter (Signed)
Patient called in and wanted to speak to [facility] about her ultrasound and her upcoming CT, tried to schedule appointment with patient she declined and said she just she wanted to speak with Aldona Bar or Butch Penny.

## 2021-02-06 ENCOUNTER — Encounter: Payer: Self-pay | Admitting: Family Medicine

## 2021-02-06 ENCOUNTER — Ambulatory Visit
Admission: RE | Admit: 2021-02-06 | Discharge: 2021-02-06 | Disposition: A | Discharge status: Home / Self Care | Payer: Commercial Managed Care - PPO | Source: Ambulatory Visit | Attending: Family Medicine | Admitting: Family Medicine

## 2021-02-06 DIAGNOSIS — R221 Localized swelling, mass and lump, neck: Secondary | ICD-10-CM

## 2021-02-06 MED ORDER — IOPAMIDOL (ISOVUE-300) INJECTION 61%
75.0000 mL | Freq: Once | INTRAVENOUS | Status: AC | PRN
  Administered 2021-02-06: 75 mL via INTRAVENOUS

## 2021-02-07 NOTE — Telephone Encounter (Signed)
Pt already had done and Dr. Jonni Sanger answered pt's questions.

## 2021-02-12 ENCOUNTER — Ambulatory Visit
Admission: RE | Admit: 2021-02-12 | Discharge: 2021-02-12 | Disposition: A | Discharge status: Home / Self Care | Payer: Commercial Managed Care - PPO | Source: Ambulatory Visit | Attending: Obstetrics and Gynecology | Admitting: Obstetrics and Gynecology

## 2021-02-12 ENCOUNTER — Other Ambulatory Visit: Payer: Self-pay

## 2021-02-12 DIAGNOSIS — Z1231 Encounter for screening mammogram for malignant neoplasm of breast: Secondary | ICD-10-CM

## 2021-02-13 ENCOUNTER — Other Ambulatory Visit: Payer: Self-pay | Admitting: Obstetrics and Gynecology

## 2021-02-13 DIAGNOSIS — R928 Other abnormal and inconclusive findings on diagnostic imaging of breast: Secondary | ICD-10-CM

## 2021-02-16 ENCOUNTER — Other Ambulatory Visit: Payer: Commercial Managed Care - PPO

## 2021-02-25 HISTORY — PX: BREAST BIOPSY: SHX20

## 2021-03-05 ENCOUNTER — Ambulatory Visit
Admission: RE | Admit: 2021-03-05 | Discharge: 2021-03-05 | Disposition: A | Discharge status: Home / Self Care | Payer: Commercial Managed Care - PPO | Source: Ambulatory Visit | Attending: Obstetrics and Gynecology | Admitting: Obstetrics and Gynecology

## 2021-03-05 ENCOUNTER — Other Ambulatory Visit: Payer: Self-pay

## 2021-03-05 ENCOUNTER — Other Ambulatory Visit: Payer: Self-pay | Admitting: Obstetrics and Gynecology

## 2021-03-05 DIAGNOSIS — R928 Other abnormal and inconclusive findings on diagnostic imaging of breast: Secondary | ICD-10-CM

## 2021-03-07 ENCOUNTER — Other Ambulatory Visit: Payer: Commercial Managed Care - PPO

## 2021-03-08 ENCOUNTER — Other Ambulatory Visit: Payer: Self-pay

## 2021-03-08 ENCOUNTER — Ambulatory Visit
Admission: RE | Admit: 2021-03-08 | Discharge: 2021-03-08 | Disposition: A | Discharge status: Home / Self Care | Payer: Commercial Managed Care - PPO | Source: Ambulatory Visit | Attending: Obstetrics and Gynecology | Admitting: Obstetrics and Gynecology

## 2021-03-08 DIAGNOSIS — R928 Other abnormal and inconclusive findings on diagnostic imaging of breast: Secondary | ICD-10-CM

## 2021-03-14 ENCOUNTER — Ambulatory Visit
Admission: RE | Admit: 2021-03-14 | Discharge: 2021-03-14 | Disposition: A | Discharge status: Home / Self Care | Payer: Commercial Managed Care - PPO | Source: Ambulatory Visit | Attending: Obstetrics and Gynecology | Admitting: Obstetrics and Gynecology

## 2021-03-14 ENCOUNTER — Other Ambulatory Visit: Payer: Self-pay

## 2021-07-19 ENCOUNTER — Other Ambulatory Visit: Payer: Self-pay | Admitting: Physician Assistant

## 2021-08-13 ENCOUNTER — Other Ambulatory Visit: Payer: Self-pay | Admitting: Obstetrics and Gynecology

## 2021-08-13 DIAGNOSIS — N6489 Other specified disorders of breast: Secondary | ICD-10-CM

## 2021-09-10 ENCOUNTER — Ambulatory Visit
Admission: RE | Admit: 2021-09-10 | Discharge: 2021-09-10 | Disposition: A | Discharge status: Home / Self Care | Payer: Commercial Managed Care - PPO | Source: Ambulatory Visit | Attending: Obstetrics and Gynecology | Admitting: Obstetrics and Gynecology

## 2021-09-10 ENCOUNTER — Other Ambulatory Visit: Payer: Self-pay

## 2021-09-10 DIAGNOSIS — N6489 Other specified disorders of breast: Secondary | ICD-10-CM

## 2021-09-25 ENCOUNTER — Ambulatory Visit (INDEPENDENT_AMBULATORY_CARE_PROVIDER_SITE_OTHER): Payer: Commercial Managed Care - PPO | Admitting: Physician Assistant

## 2021-09-25 ENCOUNTER — Ambulatory Visit (INDEPENDENT_AMBULATORY_CARE_PROVIDER_SITE_OTHER)
Admission: RE | Admit: 2021-09-25 | Discharge: 2021-09-25 | Disposition: A | Discharge status: Home / Self Care | Payer: Commercial Managed Care - PPO | Source: Ambulatory Visit | Attending: Physician Assistant | Admitting: Physician Assistant

## 2021-09-25 ENCOUNTER — Other Ambulatory Visit: Payer: Self-pay

## 2021-09-25 ENCOUNTER — Encounter: Payer: Self-pay | Admitting: Physician Assistant

## 2021-09-25 VITALS — BP 120/86 | HR 92 | Temp 97.3°F | Ht 63.0 in | Wt 153.0 lb

## 2021-09-25 DIAGNOSIS — R61 Generalized hyperhidrosis: Secondary | ICD-10-CM

## 2021-09-25 DIAGNOSIS — M549 Dorsalgia, unspecified: Secondary | ICD-10-CM

## 2021-09-25 MED ORDER — LISINOPRIL 10 MG PO TABS: 10.0000 mg | ORAL_TABLET | Freq: Every day | ORAL | 1 refills | Status: DC

## 2021-09-25 MED ORDER — FAMOTIDINE 20 MG PO TABS: 20.0000 mg | ORAL_TABLET | ORAL | 3 refills | Status: DC | PRN

## 2021-09-25 NOTE — Progress Notes (Signed)
Cassandra Warren is a 55 y.o. female here for back pain.  History of Present Illness:   Chief Complaint  Patient presents with   Back Pain    Pt c/o pain mid back and left shoulder x several months off and on, especially when she takes a deep. She had the Flu? Friday fever broke on Sunday. Pt tested for COVID was Neg.    HPI  Back Pain Has been going on for a few months. Intermittent. Pain is in the upper middle/left area of back. Does not radiate. Can reproduce symptoms with certain movements. Massage therapy has helped her symptoms. Has also tried heat, ice, ibuprofen. She can have pain with deep inspiration. Denies hemoptysis, unintentional weight loss, L-sided chest pain, SOB. Has had night sweats x 2 weeks. Had exposure to flu and flu -like symptoms over the weekend. She tested for COVID and was negative.  She is very active. Does body pump regularly and cardio. Denies worsening symptoms with these activity. She recently had her labs checked with ob-gyn in Sep but I do not have them review. She states that her thyroid was uncontrolled and her dosage was changed.  She states that her MGM had lung CA and she is worried that she may have this.   Past Medical History:  Diagnosis Date   Anemia    Arthritis    back   Diverticulosis 08/2019   Noted on Colonoscopy   GERD (gastroesophageal reflux disease)    History of colon polyps 08/2019   History of COVID-19    tested positive 3/1 no symptoms restested a couple of days later negative results x2   Hypertension    Kidney lesion    Mass of perirectal soft tissue    Thyroid disease      Social History   Tobacco Use   Smoking status: Never   Smokeless tobacco: Never  Vaping Use   Vaping Use: Never used  Substance Use Topics   Alcohol use: Yes    Comment: occ   Drug use: No    Past Surgical History:  Procedure Laterality Date   BREAST CYST ASPIRATION Left    CESAREAN SECTION     COLONOSCOPY  09/10/2019   MASS EXCISION  N/A 01/21/2020   Procedure: EXCISION PERIRECTAL CYST, EXCISION OF ANAL CANAL POLYP, HEMORRHOID LIGATION/PEXY;  Surgeon: Michael Boston, MD;  Location: [facility];  Service: General;  Laterality: N/A;   OOPHORECTOMY     left ovary only   RECTAL EXAM UNDER ANESTHESIA N/A 01/21/2020   Procedure: ANAL EXAM UNDER ANESTHESIA;  Surgeon: Michael Boston, MD;  Location: [facility];  Service: General;  Laterality: N/A;   WISDOM TOOTH EXTRACTION      Family History  Problem Relation Age of Onset   Diabetes Mother    Heart disease Father    Mental illness Brother    Lung cancer Maternal Grandmother    Breast cancer Maternal Aunt     Allergies  Allergen Reactions   Erythromycin Other (See Comments)    Hands and feet tingling   Morphine And Related Rash   Penicillin G Palpitations    Current Medications:   Current Outpatient Medications:    SYNTHROID 75 MCG tablet, Take 75 mcg by mouth daily., Disp: , Rfl:    famotidine (PEPCID) 20 MG tablet, Take 1 tablet (20 mg total) by mouth as needed for heartburn or indigestion., Disp: 90 tablet, Rfl: 3   lisinopril (ZESTRIL) 10 MG tablet, Take  1 tablet (10 mg total) by mouth daily., Disp: 90 tablet, Rfl: 1   Review of Systems:   ROS Negative unless otherwise specified per HPI. Vitals:   Vitals:   09/25/21 1534  BP: 120/86  Pulse: 92  Temp: (!) 97.3 F (36.3 C)  TempSrc: Temporal  SpO2: 99%  Weight: 153 lb (69.4 kg)  Height: 5\' 3"  (1.6 m)     Body mass index is 27.1 kg/m.  Physical Exam:   Physical Exam Vitals and nursing note reviewed.  Constitutional:      General: She is not in acute distress.    Appearance: She is well-developed. She is not ill-appearing or toxic-appearing.  Cardiovascular:     Rate and Rhythm: Normal rate and regular rhythm.     Pulses: Normal pulses.     Heart sounds: Normal heart sounds, S1 normal and S2 normal.  Pulmonary:     Effort: Pulmonary effort is normal.     Breath  sounds: Normal breath sounds.  Musculoskeletal:     Comments: Normal ROM of upper extremities; no pain with resisted abduction No obvious deformity of upper back No TTP  Skin:    General: Skin is warm and dry.  Neurological:     Mental Status: She is alert.     GCS: GCS eye subscore is 4. GCS verbal subscore is 5. GCS motor subscore is 6.  Psychiatric:        Speech: Speech normal.        Behavior: Behavior normal. Behavior is cooperative.    Assessment and Plan:   Upper back pain Uncontrolled No red flags on exam; vitals stable Wells Criteria is zero Suspect muscle strain Will obtain CXR given chronicity of symptoms and night sweats Referral to sports medicine for further evaluation  Night sweats Possibly related to hormonal changes and recent thyroid dosage change She declined blood work today to evaluate this Continue to monitor; obtain xray as above Follow-up if persists  I,Havlyn C Ratchford,acting as a Education administrator for [employer], PA.,have documented all relevant documentation on the behalf of [name], PA,as directed by  [name], PA while in the presence of [name], [place].  I, [name], [place], have reviewed all documentation for this visit. The documentation on 09/25/21 for the exam, diagnosis, procedures, and orders are all accurate and complete.   [name], PA-C

## 2021-09-25 NOTE — Patient Instructions (Signed)
It was great to see you!  We will obtain chest xray at your convenience. I will be in touch if/when I get labwork from Dr. Ronita Hipps. I will put in referral for you to see our sports medicine providers to further evaluate your symptoms.   An order for an xray has been put in for you. To get your xray, you can walk in at the [facility] location without a scheduled appointment.  The address is [address]. It is across the street from [facility] is located in the basement.  Hours of operation are M-F 8:30am to 5:00pm. Please note that they are closed for lunch between 12:30 and 1:00pm.   A referral has been placed for you to see a sports medicine provider with [facility]. Someone from there office will be in touch soon regarding your appointment with him. His location: [place] at [facility] [address] on the 1st floor.   Phone number [phone], Fax [phone].  This location is across the street from the entrance to [employer] and in the same complex as the [facility] and [employer] bank   Take care,  [name] PA-C

## 2021-10-22 ENCOUNTER — Encounter: Payer: Self-pay | Admitting: Physician Assistant

## 2021-11-19 IMAGING — MG MM BREAST LOCALIZATION CLIP
4 series · 4 of 12 positions shown · non-contrast
Comparison: Previous exam(s).

CLINICAL DATA: Post biopsy mammogram of the left breast for clip
placement.

EXAM:
DIAGNOSTIC LEFT MAMMOGRAM POST STEREOTACTIC BIOPSY

[L CC synth-2D]
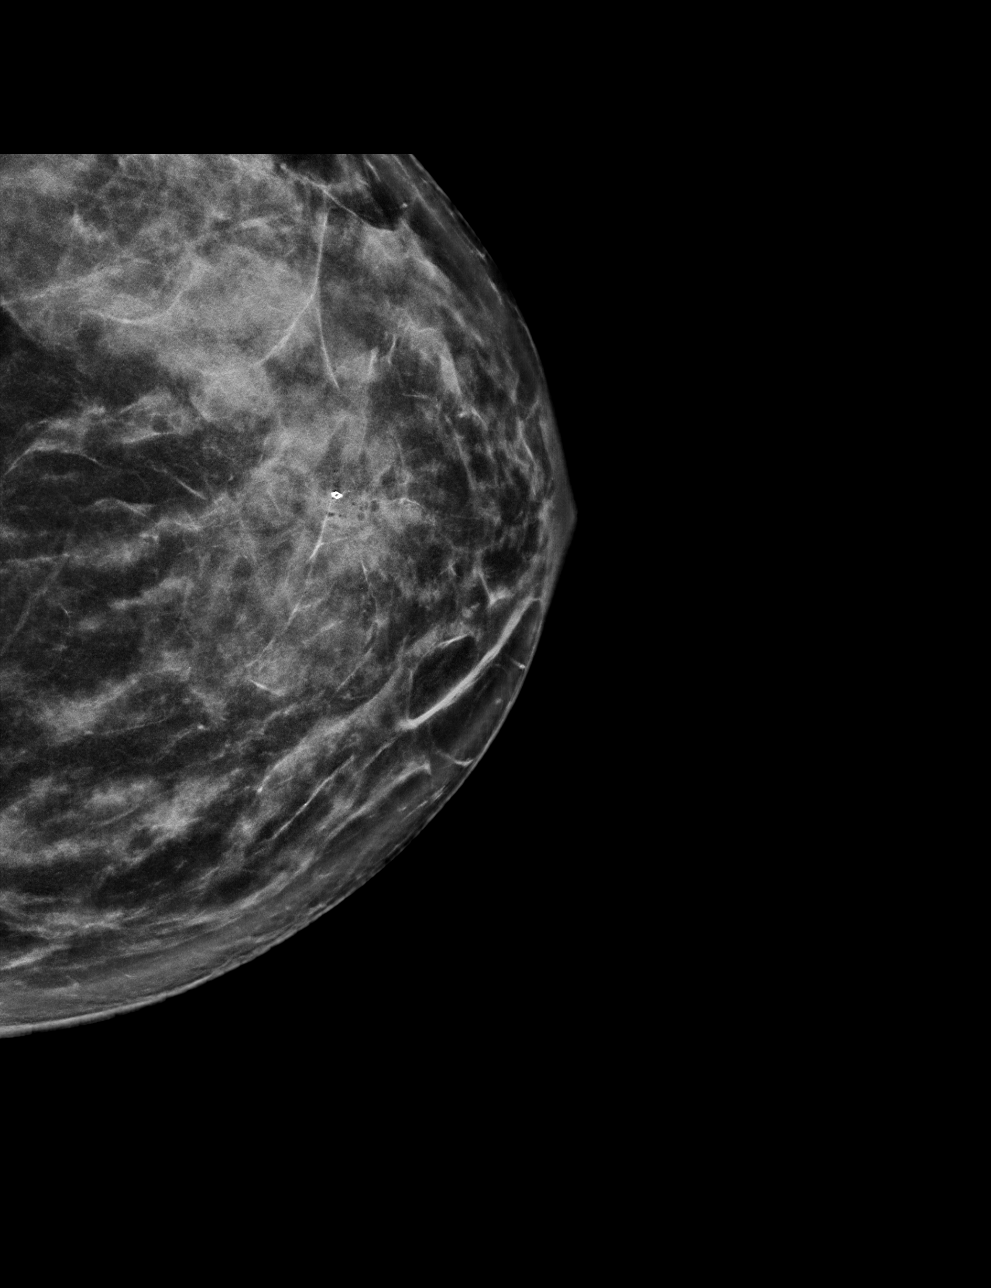

[L ML synth-2D]
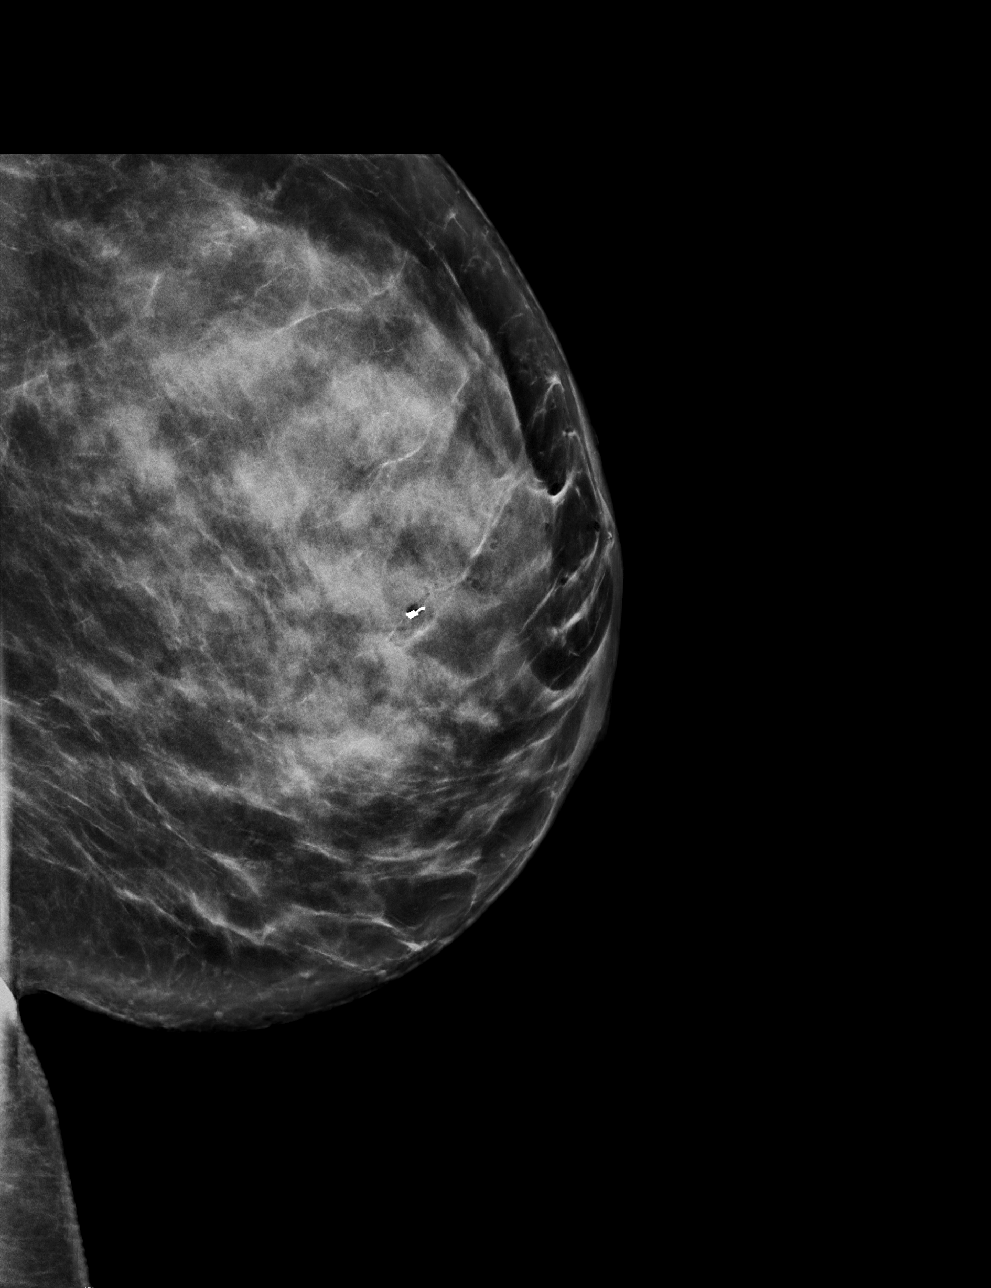

[L CC tomo · tomo slice 36/71.0]
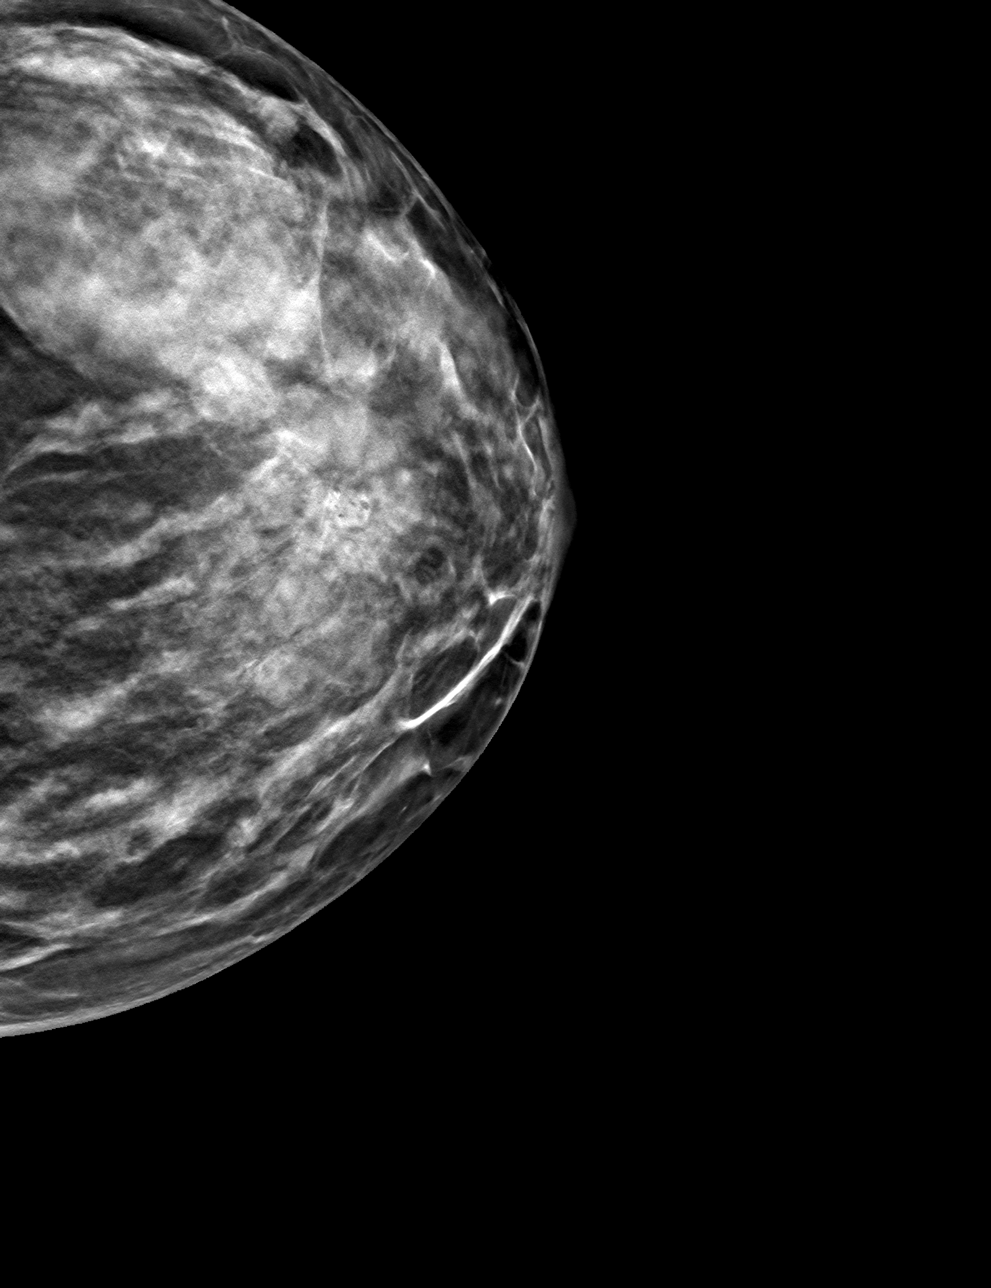

[L ML tomo · tomo slice 39/76.0]
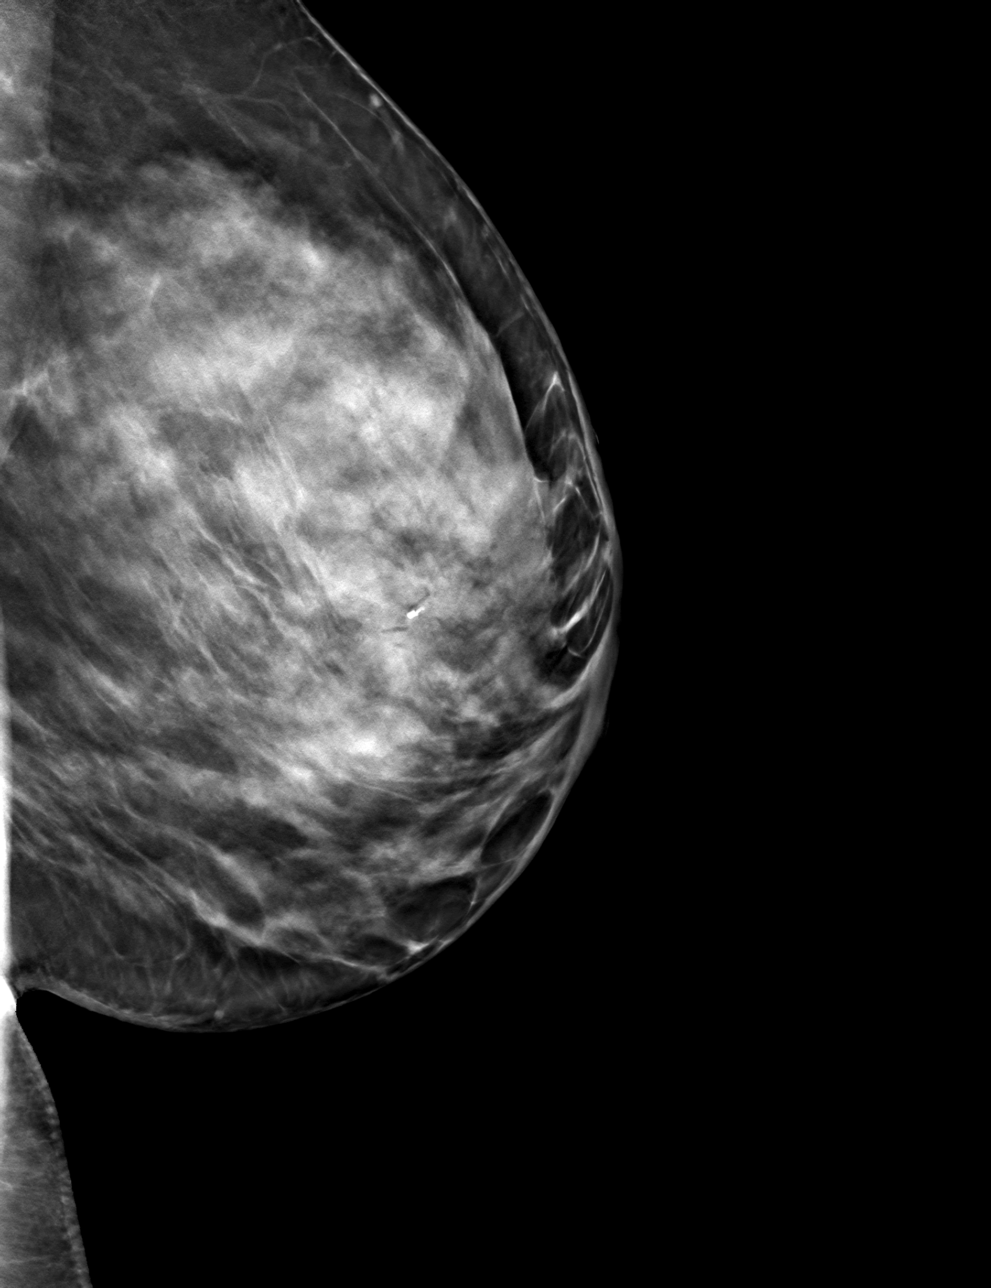

[4 of 12 positions shown; findings below may reference images not displayed]

FINDINGS: Mammographic images were obtained following stereotactic guided
biopsy of subtle distortion in the slightly superior retroareolar
left breast. The biopsy marking clip is in expected position at the
site of biopsy.
IMPRESSION: Appropriate positioning of the coil shaped biopsy marking clip at
the site of biopsy in the superior retroareolar left breast.

Final Assessment: Post Procedure Mammograms for Marker Placement

## 2022-01-15 ENCOUNTER — Other Ambulatory Visit: Payer: Self-pay | Admitting: Physician Assistant

## 2022-01-30 ENCOUNTER — Other Ambulatory Visit: Payer: Self-pay | Admitting: Obstetrics and Gynecology

## 2022-01-30 DIAGNOSIS — Z1231 Encounter for screening mammogram for malignant neoplasm of breast: Secondary | ICD-10-CM

## 2022-02-05 ENCOUNTER — Ambulatory Visit: Payer: Commercial Managed Care - PPO | Admitting: Physician Assistant

## 2022-02-06 LAB — HEMOGLOBIN A1C: Hemoglobin A1C: 5.6

## 2022-02-06 LAB — TSH: TSH: 1.46 (ref 0.41–5.90)

## 2022-02-06 LAB — BASIC METABOLIC PANEL
BUN: 14 (ref 4–21)
CO2: 20 (ref 13–22)
Chloride: 103 (ref 99–108)
Creatinine: 0.9 (ref 0.5–1.1)
Glucose: 85
Potassium: 4.1 mEq/L (ref 3.5–5.1)
Sodium: 140 (ref 137–147)

## 2022-02-06 LAB — CBC AND DIFFERENTIAL
HCT: 39 (ref 36–46)
Hemoglobin: 12.8 (ref 12.0–16.0)
Neutrophils Absolute: 3.8
Platelets: 267 10*3/uL (ref 150–400)
WBC: 6.4

## 2022-02-06 LAB — HEPATIC FUNCTION PANEL
ALT: 16 U/L (ref 7–35)
AST: 16 (ref 13–35)
Alkaline Phosphatase: 86 (ref 25–125)
Bilirubin, Total: 0.7

## 2022-02-06 LAB — LIPID PANEL
Cholesterol: 246 — AB (ref 0–200)
HDL: 49 (ref 35–70)
LDL Cholesterol: 175
Triglycerides: 120 (ref 40–160)

## 2022-02-06 LAB — IRON,TIBC AND FERRITIN PANEL
Ferritin: 15
Iron: 79

## 2022-02-06 LAB — CBC: RBC: 4.39 (ref 3.87–5.11)

## 2022-02-06 LAB — VITAMIN D 25 HYDROXY (VIT D DEFICIENCY, FRACTURES): Vit D, 25-Hydroxy: 35.4

## 2022-02-06 LAB — COMPREHENSIVE METABOLIC PANEL
Albumin: 4.3 (ref 3.5–5.0)
Calcium: 9.3 (ref 8.7–10.7)
Globulin: 2.9
eGFR: 73

## 2022-02-06 LAB — VITAMIN B12: Vitamin B-12: 641

## 2022-02-12 ENCOUNTER — Encounter: Payer: Self-pay | Admitting: Physician Assistant

## 2022-02-12 ENCOUNTER — Ambulatory Visit (INDEPENDENT_AMBULATORY_CARE_PROVIDER_SITE_OTHER): Payer: Commercial Managed Care - PPO | Admitting: Physician Assistant

## 2022-02-12 VITALS — BP 127/81 | HR 82 | Temp 97.8°F | Ht 63.0 in | Wt 151.6 lb

## 2022-02-12 DIAGNOSIS — D500 Iron deficiency anemia secondary to blood loss (chronic): Secondary | ICD-10-CM

## 2022-02-12 DIAGNOSIS — E039 Hypothyroidism, unspecified: Secondary | ICD-10-CM | POA: Diagnosis not present

## 2022-02-12 DIAGNOSIS — Z0000 Encounter for general adult medical examination without abnormal findings: Secondary | ICD-10-CM | POA: Diagnosis not present

## 2022-02-12 DIAGNOSIS — I10 Essential (primary) hypertension: Secondary | ICD-10-CM | POA: Diagnosis not present

## 2022-02-12 DIAGNOSIS — E785 Hyperlipidemia, unspecified: Secondary | ICD-10-CM

## 2022-02-12 NOTE — Patient Instructions (Addendum)
It was great to see you! ? ?Please get blood work in 3 months at [facility] draw station of your choice, I will be in touch with results. ? ?Take care, ? ?Aldona Bar ?  ?

## 2022-02-12 NOTE — Progress Notes (Signed)
? ?Subjective:  ?  ?Cassandra Warren is a 56 y.o. female and is here for a comprehensive physical exam. ? ? ?HPI ? ?There are no preventive care reminders to display for this patient. ? ?Acute Concerns: ?None reported.  ? ?Chronic Issues: ?HTN ?Cassandra Warren has remained compliant with taking zestril 10 mg daily with no complications. At home blood pressure readings are: not checked often, usually when she feels ill which isn't often. Patient denies chest pain, SOB, blurred vision, dizziness, unusual headaches, lower leg swelling. Denies excessive caffeine intake, stimulant usage, excessive alcohol intake, or increase in salt consumption. ? ?BP Readings from Last 3 Encounters:  ?02/12/22 127/81  ?09/25/21 120/86  ?01/31/21 130/82  ? ? ?Iron Deficiency  ?Pt has been taking OTC iron supplement 28 mg daily following recent labs completed on 02/06/22. In between completion of labs, she tried taking some of her husband's iron supplements but due to GI upset she stopped use. She also believes that not having a cycle for 5 months could have improved her iron levels since heavy cycles have contributed to iron deficiency in the past.  ? ?Hypothyroidism ?Cassandra Warren is currently compliant with taking synthroid 75 mcg daily with no complications. She is regularly following up with Dr. Ronita Hipps about this issue and is managing well.  ? ?HLD  ?Although her levels have been slightly elevated, pt has not needed medication. She does have a fhx of heart disease and is trying her best to not increase her chances of stroke or MI. Pt is currently exercising regularly, about 3-4 days x week, but does admit to indulging in fried food at times. At this time she is interested in hammering down on her diet and re-checking these labs to see if this improved levels. Denies CP or SOB.  ? ?Health Maintenance: ?Immunizations -- Covid- UTD ?Influenza- Declined ?Tdap- UTD;2019 ?Colonoscopy -- LXB;2620 ?Mammogram -- Will update in May 2023 ?PAP --  BTD;9741 ?Bone Density -- N/A ?Diet -- Eats all food groups  ?Sleep habits -- No concerns ?Exercise -- Cardio 3 days x week ?Weight -- Stable ?Mood -- No concerns ?Weight history: ?Wt Readings from Last 10 Encounters:  ?02/12/22 151 lb 9.6 oz (68.8 kg)  ?09/25/21 153 lb (69.4 kg)  ?01/31/21 153 lb 6.4 oz (69.6 kg)  ?07/28/20 156 lb (70.8 kg)  ?06/20/20 153 lb (69.4 kg)  ?01/21/20 151 lb 12.8 oz (68.9 kg)  ?11/26/19 155 lb 3.2 oz (70.4 kg)  ?09/10/19 152 lb (68.9 kg)  ?08/27/19 152 lb (68.9 kg)  ?07/09/19 152 lb 6.1 oz (69.1 kg)  ? ?Body mass index is 26.85 kg/m?Marland Kitchen ?No LMP recorded. ?Alcohol use:  reports current alcohol use. ?Tobacco use:  ?Tobacco Use: Low Risk   ? Smoking Tobacco Use: Never  ? Smokeless Tobacco Use: Never  ? Passive Exposure: Not on file  ? ? ? ? ?  02/12/2022  ?  2:31 PM  ?Depression screen PHQ 2/9  ?Decreased Interest 0  ?Down, Depressed, Hopeless 0  ?PHQ - 2 Score 0  ? ? ? ?Other providers/specialists: ?Patient Care Team: ?[name], PA as PCP - General (Physician Assistant) ?Obgyn, Erling Conte as Consulting Physician (Obstetrics and Gynecology) ?Michael Boston, MD as Consulting Physician (General Surgery)  ? ? ?PMHx, SurgHx, SocialHx, Medications, and Allergies were reviewed in the Visit Navigator and updated as appropriate.  ? ?Past Medical History:  ?Diagnosis Date  ? Anemia   ? Arthritis   ? back  ? Diverticulosis 08/2019  ? Noted on Colonoscopy  ?  GERD (gastroesophageal reflux disease)   ? History of colon polyps 08/2019  ? History of COVID-19   ? tested positive 3/1 no symptoms restested a couple of days later negative results x2  ? Hypertension   ? Kidney lesion   ? Mass of perirectal soft tissue   ? Thyroid disease   ? ? ? ?Past Surgical History:  ?Procedure Laterality Date  ? BREAST CYST ASPIRATION Left   ? CESAREAN SECTION    ? COLONOSCOPY  09/10/2019  ? MASS EXCISION N/A 01/21/2020  ? Procedure: EXCISION PERIRECTAL CYST, EXCISION OF ANAL CANAL POLYP, HEMORRHOID LIGATION/PEXY;   Surgeon: Michael Boston, MD;  Location: [place];  Service: General;  Laterality: N/A;  ? OOPHORECTOMY    ? left ovary only  ? RECTAL EXAM UNDER ANESTHESIA N/A 01/21/2020  ? Procedure: ANAL EXAM UNDER ANESTHESIA;  Surgeon: Michael Boston, MD;  Location: [place];  Service: General;  Laterality: N/A;  ? WISDOM TOOTH EXTRACTION    ? ? ? ?Family History  ?Problem Relation Age of Onset  ? Diabetes Mother   ? Heart disease Father   ? Mental illness Brother   ? Lung cancer Maternal Grandmother   ? Breast cancer Maternal Aunt   ? ? ?Social History  ? ?Tobacco Use  ? Smoking status: Never  ? Smokeless tobacco: Never  ?Vaping Use  ? Vaping Use: Never used  ?Substance Use Topics  ? Alcohol use: Yes  ?  Comment: occ  ? Drug use: No  ? ? ?Review of Systems:  ? ?Review of Systems  ?Constitutional:  Negative for chills, fever, malaise/fatigue and weight loss.  ?HENT:  Negative for hearing loss, sinus pain and sore throat.   ?Respiratory:  Negative for cough and hemoptysis.   ?Cardiovascular:  Negative for chest pain, palpitations, leg swelling and PND.  ?Gastrointestinal:  Negative for abdominal pain, constipation, diarrhea, heartburn, nausea and vomiting.  ?Genitourinary:  Negative for dysuria, frequency and urgency.  ?Musculoskeletal:  Negative for back pain, myalgias and neck pain.  ?Skin:  Negative for itching and rash.  ?Neurological:  Negative for dizziness, tingling, seizures and headaches.  ?Endo/Heme/Allergies:  Negative for polydipsia.  ?Psychiatric/Behavioral:  Negative for depression. The patient is not nervous/anxious.   ? ? ?Objective:  ? ?BP 127/81 (BP Location: Left Arm)   Pulse 82   Temp 97.8 ?F (36.6 ?C) (Temporal)   Ht '5\' 3"'$  (1.6 m)   Wt 151 lb 9.6 oz (68.8 kg)   SpO2 100%   BMI 26.85 kg/m?  ?Body mass index is 26.85 kg/m?. ? ? ?General Appearance:    Alert, cooperative, no distress, appears stated age  ?Head:    Normocephalic, without obvious abnormality, atraumatic   ?Eyes:    PERRL, conjunctiva/corneas clear, EOM's intact, fundi  ?  benign, both eyes  ?Ears:    Normal TM's and external ear canals, both ears  ?Nose:   Nares normal, septum midline, mucosa normal, no drainage    or sinus tenderness  ?Throat:   Lips, mucosa, and tongue normal; teeth and gums normal  ?Neck:   Supple, symmetrical, trachea midline, no adenopathy;  ?  thyroid:  no enlargement/tenderness/nodules; no carotid ?  bruit or JVD  ?Back:     Symmetric, no curvature, ROM normal, no CVA tenderness  ?Lungs:     Clear to auscultation bilaterally, respirations unlabored  ?Chest Wall:    No tenderness or deformity  ? Heart:    Regular rate and rhythm, S1 and  S2 normal, no murmur, rub or gallop  ?Breast Exam:    Deferred  ?Abdomen:     Soft, non-tender, bowel sounds active all four quadrants,  ?  no masses, no organomegaly  ?Genitalia:    Deferred  ?Extremities:   Extremities normal, atraumatic, no cyanosis or edema  ?Pulses:   2+ and symmetric all extremities  ?Skin:   Skin color, texture, turgor normal, no rashes or lesions  ?Lymph nodes:   Cervical, supraclavicular, and axillary nodes normal  ?Neurologic:   CNII-XII intact, normal strength, sensation and reflexes  ?  throughout  ? ? ?Assessment/Plan:  ? ?Routine physical examination ?Today patient counseled on age appropriate routine health concerns for screening and prevention, each reviewed and up to date or declined. Immunizations reviewed and up to date or declined. Labs ordered and reviewed. Risk factors for depression reviewed and negative. Hearing function and visual acuity are intact. ADLs screened and addressed as needed. Functional ability and level of safety reviewed and appropriate. Education, counseling and referrals performed based on assessed risks today. Patient provided with a copy of personalized plan for preventive services. ? ?Essential hypertension ?Controlled ?Continue lisinopril 10 mg daily  ?Monitor BP regularly 1-2 times a  week ?Encouraged patient to continue participating in healthy eating and daily exercise ?I advised patient that if BP readings are consistently >150/100, to reach out to office and medication will be adjusted ?Follow up in 6 months, sooner if co

## 2022-02-15 ENCOUNTER — Encounter: Payer: Commercial Managed Care - PPO | Admitting: Physician Assistant

## 2022-02-27 ENCOUNTER — Other Ambulatory Visit: Payer: Self-pay | Admitting: Obstetrics and Gynecology

## 2022-02-27 DIAGNOSIS — Z09 Encounter for follow-up examination after completed treatment for conditions other than malignant neoplasm: Secondary | ICD-10-CM

## 2022-03-06 ENCOUNTER — Ambulatory Visit
Admission: RE | Admit: 2022-03-06 | Discharge: 2022-03-06 | Disposition: A | Discharge status: Home / Self Care | Payer: Commercial Managed Care - PPO | Source: Ambulatory Visit | Attending: Obstetrics and Gynecology | Admitting: Obstetrics and Gynecology

## 2022-03-06 ENCOUNTER — Ambulatory Visit: Payer: Commercial Managed Care - PPO

## 2022-03-06 DIAGNOSIS — Z09 Encounter for follow-up examination after completed treatment for conditions other than malignant neoplasm: Secondary | ICD-10-CM

## 2022-03-11 ENCOUNTER — Encounter: Payer: Self-pay | Admitting: Physician Assistant

## 2022-07-24 ENCOUNTER — Other Ambulatory Visit: Payer: Self-pay | Admitting: Physician Assistant

## 2022-08-21 ENCOUNTER — Encounter: Payer: Self-pay | Admitting: Physician Assistant

## 2022-08-21 ENCOUNTER — Ambulatory Visit (INDEPENDENT_AMBULATORY_CARE_PROVIDER_SITE_OTHER): Payer: Commercial Managed Care - PPO | Admitting: Physician Assistant

## 2022-08-21 VITALS — BP 110/70 | HR 89 | Temp 97.7°F | Ht 63.0 in | Wt 149.4 lb

## 2022-08-21 DIAGNOSIS — I10 Essential (primary) hypertension: Secondary | ICD-10-CM

## 2022-08-21 DIAGNOSIS — F419 Anxiety disorder, unspecified: Secondary | ICD-10-CM

## 2022-08-21 DIAGNOSIS — E039 Hypothyroidism, unspecified: Secondary | ICD-10-CM

## 2022-08-21 DIAGNOSIS — Z636 Dependent relative needing care at home: Secondary | ICD-10-CM

## 2022-08-21 DIAGNOSIS — R002 Palpitations: Secondary | ICD-10-CM | POA: Diagnosis not present

## 2022-08-21 MED ORDER — BUSPIRONE HCL 7.5 MG PO TABS: ORAL_TABLET | ORAL | 1 refills | Status: DC

## 2022-08-21 MED ORDER — CLONAZEPAM 0.5 MG PO TABS: 0.5000 mg | ORAL_TABLET | Freq: Two times a day (BID) | ORAL | 0 refills | Status: AC | PRN

## 2022-08-21 NOTE — Progress Notes (Signed)
Cassandra Warren is a 56 y.o. female here for a new problem.  History of Present Illness:   Chief Complaint  Patient presents with   Panic Attack    Pt is having a lot of stress and had panic attack last week and yesterday.    HPI  Anxiety Reports racing heart beats, tingling, and diaphoresis with panic attacks. Husband has been sick, had a diabetic seizure and recent back surgery. She has been having anxiety lately during the night and worries about her husband's health and upcoming trips they are taking where they will be separated from each other. Reports stress this time of year near anniversary of grandson's death. She is opposed to taking daily anxiety medication at this time, but she has agreed to trying out prn medication. Has tried Klonopin and Xanax prn in the past without issues.  Palpitations Intermittent rapid heart beats. Often wakes her at night. Suspects it is due to her anxiety. Mother has Hx of A-fib. Has never worn a heart monitor. TSH normal. Denies chest pain, SOB, LE swelling.  Hypertension Blood pressure is well controlled. Remains compliant with taking Zestril 10 mg daily with no complications. At home blood pressure readings are: not checked often, usually when she feels ill which isn't often. Well controlled when she does check. Patient denies chest pain, SOB, blurred vision, dizziness, unusual headaches, lower leg swelling. Denies excessive caffeine intake, stimulant usage, excessive alcohol intake, or increase in salt consumption.  Hypothyroidism Currently taking synthroid 75 mcg daily. Has recent results from August that were WNL.  Past Medical History:  Diagnosis Date   Allergy 02-24-1966   Anemia    Arthritis    back   Diverticulosis 08/2019   Noted on Colonoscopy   GERD (gastroesophageal reflux disease)    History of colon polyps 08/2019   History of COVID-19    tested positive 3/1 no symptoms restested a couple of days later negative  results x2   Hypertension    Kidney lesion    Mass of perirectal soft tissue    Thyroid disease      Social History   Tobacco Use   Smoking status: Never   Smokeless tobacco: Never   Tobacco comments:    N/A  Vaping Use   Vaping Use: Never used  Substance Use Topics   Alcohol use: Not Currently    Comment: occ   Drug use: No    Past Surgical History:  Procedure Laterality Date   BREAST BIOPSY Left    BREAST CYST ASPIRATION Left    CESAREAN SECTION     COLONOSCOPY  09/10/2019   MASS EXCISION N/A 01/21/2020   Procedure: EXCISION PERIRECTAL CYST, EXCISION OF ANAL CANAL POLYP, HEMORRHOID LIGATION/PEXY;  Surgeon: Michael Boston, MD;  Location: [place];  Service: General;  Laterality: N/A;   OOPHORECTOMY     left ovary only   RECTAL EXAM UNDER ANESTHESIA N/A 01/21/2020   Procedure: ANAL EXAM UNDER ANESTHESIA;  Surgeon: Michael Boston, MD;  Location: [facility];  Service: General;  Laterality: N/A;   WISDOM TOOTH EXTRACTION      Family History  Problem Relation Age of Onset   Diabetes Mother    Heart disease Father    Mental illness Brother    Lung cancer Maternal Grandmother    Breast cancer Maternal Aunt     Allergies  Allergen Reactions   Erythromycin Other (See Comments)    Hands and feet tingling   Morphine And  Related Rash   Penicillin G Palpitations    Current Medications:   Current Outpatient Medications:    busPIRone (BUSPAR) 7.5 MG tablet, May take twice daily as needed for anxiety, Disp: 30 tablet, Rfl: 1   clonazePAM (KLONOPIN) 0.5 MG tablet, Take 1 tablet (0.5 mg total) by mouth 2 (two) times daily as needed for anxiety., Disp: 20 tablet, Rfl: 0   famotidine (PEPCID) 20 MG tablet, Take 1 tablet (20 mg total) by mouth as needed for heartburn or indigestion., Disp: 90 tablet, Rfl: 3   lisinopril (ZESTRIL) 10 MG tablet, TAKE (1) TABLET BY MOUTH ONCE DAILY., Disp: 90 tablet, Rfl: 1   Multiple Vitamin (MULTIVITAMIN)  tablet, Take 1 tablet by mouth daily., Disp: , Rfl:    SYNTHROID 75 MCG tablet, Take 75 mcg by mouth daily., Disp: , Rfl:    Review of Systems:   Review of Systems  Constitutional:  Positive for diaphoresis. Negative for chills, fever, malaise/fatigue and weight loss.  HENT:  Negative for hearing loss, sinus pain and sore throat.   Respiratory:  Negative for cough and hemoptysis.   Cardiovascular:  Positive for palpitations. Negative for chest pain, leg swelling and PND.  Gastrointestinal:  Negative for abdominal pain, constipation, diarrhea, heartburn, nausea and vomiting.  Genitourinary:  Negative for dysuria, frequency and urgency.  Musculoskeletal:  Negative for back pain, myalgias and neck pain.  Skin:  Negative for itching and rash.  Neurological:  Positive for tingling. Negative for dizziness, seizures and headaches.  Endo/Heme/Allergies:  Negative for polydipsia.  Psychiatric/Behavioral:  Negative for depression. The patient is nervous/anxious.     Vitals:   Vitals:   08/21/22 1015  BP: 110/70  Pulse: 89  Temp: 97.7 F (36.5 C)  TempSrc: Temporal  SpO2: 99%  Weight: 149 lb 6.1 oz (67.8 kg)  Height: '5\' 3"'$  (1.6 m)     Body mass index is 26.46 kg/m.  Physical Exam:   Physical Exam Vitals and nursing note reviewed.  Constitutional:      General: She is not in acute distress.    Appearance: She is well-developed. She is not ill-appearing or toxic-appearing.  HENT:     Head: Normocephalic and atraumatic.     Right Ear: Tympanic membrane, ear canal and external ear normal. Tympanic membrane is not erythematous, retracted or bulging.     Left Ear: Tympanic membrane, ear canal and external ear normal. Tympanic membrane is not erythematous, retracted or bulging.     Nose: Nose normal.     Right Sinus: No maxillary sinus tenderness or frontal sinus tenderness.     Left Sinus: No maxillary sinus tenderness or frontal sinus tenderness.     Mouth/Throat:     Pharynx: Uvula  midline. No posterior oropharyngeal erythema.  Eyes:     General: Lids are normal.     Conjunctiva/sclera: Conjunctivae normal.  Neck:     Trachea: Trachea normal.  Cardiovascular:     Rate and Rhythm: Normal rate and regular rhythm.     Pulses: Normal pulses.     Heart sounds: Normal heart sounds, S1 normal and S2 normal.  Pulmonary:     Effort: Pulmonary effort is normal.     Breath sounds: Normal breath sounds. No decreased breath sounds, wheezing, rhonchi or rales.  Lymphadenopathy:     Cervical: No cervical adenopathy.  Skin:    General: Skin is warm and dry.  Neurological:     Mental Status: She is alert.     GCS: GCS eye  subscore is 4. GCS verbal subscore is 5. GCS motor subscore is 6.  Psychiatric:        Speech: Speech normal.        Behavior: Behavior normal. Behavior is cooperative.     Assessment and Plan:   Caregiver stress; Anxiety Uncontrolled Denies SI/HI We gave her two options -- one is klonopin 0.5 mg (she has taken before and has tolerated well) for severe anxiety/flight anxiety, etc; the other is buspar 7.5 mg 1-2 times daily prn for less severe symptoms Follow-up in 1 month (mychart message is fine if patient is stable)  I discussed with patient that if they develop any SI, to tell someone immediately and seek medical attention.  Palpitations Ongoing No associated exertional sx Blood work recently checked and was WNL Will order zio patch for further evaluation and management  Essential hypertension Normotensive Continue lisinopril 10 mg daily Follow-up in 6 months, sooner if concerns  Acquired hypothyroidism Last TSH from Aug WNL Continue synthroid 75 mcg daily Follow-up in 6 months, sooner if concerns  I,Alexis Herring,acting as a Education administrator for [employer], PA.,have documented all relevant documentation on the behalf of [name], PA,as directed by  [name], PA while in the presence of [name], [place].  I, [name],  [place], have reviewed all documentation for this visit. The documentation on 08/21/22 for the exam, diagnosis, procedures, and orders are all accurate and complete.  [name] PA-C

## 2022-08-21 NOTE — Patient Instructions (Signed)
It was great to see you!  May take clonopin 0.5 mg as needed for flight anxiety or panic attacks  For less severe panic, trial the 7.5 mg buspar as needed  Let's follow-up in 1 month -- mychart message is ok to check in on medications, sooner if you have concerns.  Take care,  [name] PA-C

## 2022-08-22 ENCOUNTER — Ambulatory Visit: Payer: Commercial Managed Care - PPO | Attending: Physician Assistant

## 2022-08-22 ENCOUNTER — Telehealth: Payer: Self-pay | Admitting: Physician Assistant

## 2022-08-22 DIAGNOSIS — R002 Palpitations: Secondary | ICD-10-CM

## 2022-08-22 MED ORDER — HYDROXYZINE HCL 10 MG PO TABS: 10.0000 mg | ORAL_TABLET | Freq: Three times a day (TID) | ORAL | 0 refills | Status: DC | PRN

## 2022-08-22 NOTE — Telephone Encounter (Signed)
Please see message and advise 

## 2022-08-22 NOTE — Progress Notes (Unsigned)
Enrolled for [employer] to mail a ZIO XT long term holter monitor to the patients address on file.   DOD to read. 

## 2022-08-22 NOTE — Telephone Encounter (Signed)
Patient states she does not like the feeling she gets from [employer].  Patient requests a RX for an alternative for Buspar (to get Patient through anxiety issues) be sent to:   [place], [place] Phone: [phone]  Fax: [phone]

## 2022-08-22 NOTE — Telephone Encounter (Signed)
Spoke to pt told her Aldona Bar is sending in Hydroxyzine 10 mg three times a day as needed. Pt verbalized understanding and asked if it was a serotonin? Told pt no, it is antihistamine that helps with anxiety and allergies. Pt verbalized understanding.

## 2022-09-08 DIAGNOSIS — R002 Palpitations: Secondary | ICD-10-CM | POA: Diagnosis not present

## 2022-09-25 ENCOUNTER — Encounter: Payer: Self-pay | Admitting: Physician Assistant

## 2022-09-25 ENCOUNTER — Ambulatory Visit (INDEPENDENT_AMBULATORY_CARE_PROVIDER_SITE_OTHER): Payer: Commercial Managed Care - PPO | Admitting: Physician Assistant

## 2022-09-25 VITALS — BP 122/80 | HR 80 | Temp 97.8°F | Ht 63.0 in | Wt 152.0 lb

## 2022-09-25 DIAGNOSIS — F419 Anxiety disorder, unspecified: Secondary | ICD-10-CM | POA: Diagnosis not present

## 2022-09-25 DIAGNOSIS — R29818 Other symptoms and signs involving the nervous system: Secondary | ICD-10-CM

## 2022-09-25 DIAGNOSIS — I471 Supraventricular tachycardia, unspecified: Secondary | ICD-10-CM

## 2022-09-25 NOTE — Patient Instructions (Addendum)
It was great to see you!  Let's send you for a sleep study.  Review information on SVT.  If you want to see cardiology, let me know.  Take care,  [name] PA-C

## 2022-09-25 NOTE — Progress Notes (Signed)
Cassandra Warren is a 56 y.o. female here for a follow up of a pre-existing problem.  History of Present Illness:   Chief Complaint  Patient presents with   Anxiety   Results    Pt is here for results of heart monitor.    HPI  Anxiety Patient reports that she took 10 mg Atarax one time and stopped because it made her feel weird. She states that she was better with 0.5 mg Klonopin. She states that she frequently experiences hot flashes and night sweats. She reports that it usually occurs around people and she then begins panicking and over thinking. She does not want to take daily medication. Denies SI/HI.   SVT Patient is in today to discuss results of heart monitor.  Results are below:  Patch Wear Time:  6 days and 13 hours (2023-11-12T20:16:45-0500 to 2023-11-19T10:14:10-498)   Patient had a min HR of 54 bpm, max HR of 174 bpm, and avg HR of 79 bpm. Predominant underlying rhythm was Sinus Rhythm. 2 Supraventricular Tachycardia runs occurred, the run with the fastest interval lasting 7 beats with a max rate of 174 bpm (avg 150  bpm); the run with the fastest interval was also the longest. Supraventricular Tachycardia was detected within +/- 45 seconds of symptomatic patient event(s). Isolated SVEs were rare (<1.0%), SVE Couplets were rare (<1.0%), and SVE Triplets were rare  (<1.0%). Isolated VEs were rare (<1.0%), and no VE Couplets or VE Triplets were present.    TSH in Aug was normal -- I do not have these records but she can show me on her phone  Sleep apnea Patient reports that she heavily snores and is willing to be referred to a sleep study. She has insomnia and daytime fatigue and thinks that this could be related.    Past Medical History:  Diagnosis Date   Allergy 03/02/68   Anemia    Arthritis    back   Diverticulosis 08/2019   Noted on Colonoscopy   GERD (gastroesophageal reflux disease)    History of colon polyps 08/2019   History of COVID-19    tested  positive 3/1 no symptoms restested a couple of days later negative results x2   Hypertension    Kidney lesion    Mass of perirectal soft tissue    Thyroid disease      Social History   Tobacco Use   Smoking status: Never   Smokeless tobacco: Never   Tobacco comments:    N/A  Vaping Use   Vaping Use: Never used  Substance Use Topics   Alcohol use: Not Currently    Comment: occ   Drug use: No    Past Surgical History:  Procedure Laterality Date   BREAST BIOPSY Left    BREAST CYST ASPIRATION Left    CESAREAN SECTION     COLONOSCOPY  09/10/2019   MASS EXCISION N/A 01/21/2020   Procedure: EXCISION PERIRECTAL CYST, EXCISION OF ANAL CANAL POLYP, HEMORRHOID LIGATION/PEXY;  Surgeon: Michael Boston, MD;  Location: [facility];  Service: General;  Laterality: N/A;   OOPHORECTOMY     left ovary only   RECTAL EXAM UNDER ANESTHESIA N/A 01/21/2020   Procedure: ANAL EXAM UNDER ANESTHESIA;  Surgeon: Michael Boston, MD;  Location: [facility];  Service: General;  Laterality: N/A;   WISDOM TOOTH EXTRACTION      Family History  Problem Relation Age of Onset   Diabetes Mother    Heart disease Father  Mental illness Brother    Lung cancer Maternal Grandmother    Breast cancer Maternal Aunt     Allergies  Allergen Reactions   Erythromycin Other (See Comments)    Hands and feet tingling   Morphine And Related Rash   Penicillin G Palpitations    Current Medications:   Current Outpatient Medications:    clonazePAM (KLONOPIN) 0.5 MG tablet, Take 1 tablet (0.5 mg total) by mouth 2 (two) times daily as needed for anxiety., Disp: 20 tablet, Rfl: 0   famotidine (PEPCID) 20 MG tablet, Take 1 tablet (20 mg total) by mouth as needed for heartburn or indigestion., Disp: 90 tablet, Rfl: 3   hydrOXYzine (ATARAX) 10 MG tablet, Take 1 tablet (10 mg total) by mouth 3 (three) times daily as needed., Disp: 30 tablet, Rfl: 0   lisinopril (ZESTRIL) 10 MG tablet, TAKE (1)  TABLET BY MOUTH ONCE DAILY., Disp: 90 tablet, Rfl: 1   Multiple Vitamin (MULTIVITAMIN) tablet, Take 1 tablet by mouth daily., Disp: , Rfl:    SYNTHROID 75 MCG tablet, Take 75 mcg by mouth daily., Disp: , Rfl:    Review of Systems:   ROS Negative unless otherwise specified per HPI.  Vitals:   Vitals:   09/25/22 1311  BP: 122/80  Pulse: 80  Temp: 97.8 F (36.6 C)  TempSrc: Temporal  SpO2: 96%  Weight: 152 lb (68.9 kg)  Height: '5\' 3"'$  (1.6 m)     Body mass index is 26.93 kg/m.  Physical Exam:   Physical Exam Constitutional:      General: She is not in acute distress.    Appearance: Normal appearance. She is not ill-appearing.  HENT:     Head: Normocephalic and atraumatic.     Right Ear: External ear normal.     Left Ear: External ear normal.  Eyes:     Extraocular Movements: Extraocular movements intact.     Pupils: Pupils are equal, round, and reactive to light.  Cardiovascular:     Rate and Rhythm: Normal rate and regular rhythm.     Heart sounds: Normal heart sounds. No murmur heard.    No gallop.  Pulmonary:     Effort: Pulmonary effort is normal. No respiratory distress.     Breath sounds: Normal breath sounds. No wheezing or rales.  Skin:    General: Skin is warm and dry.  Neurological:     Mental Status: She is alert and oriented to person, place, and time.  Psychiatric:        Judgment: Judgment normal.     Assessment and Plan:   Suspected sleep apnea Referral placed to [facility] Sleep Studies  Paroxysmal SVT (supraventricular tachycardia) Discussed Offered referral to cardiology, she declined at this time Continue to monitor symptoms Follow-up if any concerns  Anxiety Overall controlled May take klonopin prn Denies SI/HI Follow-up if new/worsening or in 6 mo  I,Verona Buck,acting as a Education administrator for [employer], PA.,have documented all relevant documentation on the behalf of [name], PA,as directed by  [name], PA while in the  presence of [name], [place].  I, [name], [place], have reviewed all documentation for this visit. The documentation on 09/25/22 for the exam, diagnosis, procedures, and orders are all accurate and complete.   [name], PA-C

## 2022-11-19 ENCOUNTER — Encounter: Payer: Self-pay | Admitting: Neurology

## 2022-11-19 ENCOUNTER — Ambulatory Visit (INDEPENDENT_AMBULATORY_CARE_PROVIDER_SITE_OTHER): Payer: Commercial Managed Care - PPO | Admitting: Neurology

## 2022-11-19 VITALS — BP 148/77 | HR 79 | Ht 62.0 in | Wt 153.0 lb

## 2022-11-19 DIAGNOSIS — Z9189 Other specified personal risk factors, not elsewhere classified: Secondary | ICD-10-CM | POA: Diagnosis not present

## 2022-11-19 DIAGNOSIS — E663 Overweight: Secondary | ICD-10-CM | POA: Diagnosis not present

## 2022-11-19 DIAGNOSIS — R5383 Other fatigue: Secondary | ICD-10-CM

## 2022-11-19 DIAGNOSIS — I471 Supraventricular tachycardia, unspecified: Secondary | ICD-10-CM

## 2022-11-19 DIAGNOSIS — R351 Nocturia: Secondary | ICD-10-CM

## 2022-11-19 DIAGNOSIS — R0683 Snoring: Secondary | ICD-10-CM

## 2022-11-19 NOTE — Patient Instructions (Signed)

## 2022-11-19 NOTE — Progress Notes (Signed)
Subjective:    Patient ID: CACI ORREN is a 57 y.o. female.  HPI    Star Age, MD, PhD [facility] [address]. [place],  [address]  Dear Aldona Bar,  I saw your patient, Laine Fonner, upon your kind request in my sleep clinic today for initial consultation of her sleep disorder, in particular, concern for underlying obstructive sleep apnea.  The patient is unaccompanied today.  As you know, Ms. Holben is a 57 year old female with an underlying medical history of allergies, arthritis, anemia, diverticulosis, reflux disease, hypertension, thyroid disease, SVT, anxiety, and borderline overweight state, who reports snoring and some daytime tiredness.  Snoring can be loud and disturbing to her family and friends. Her Epworth sleepiness score is 5 out of 24, fatigue severity score is 9 out of 63.  I reviewed your office note from 09/25/2022. She had a Zio patch in late November for about a week which showed 2 runs of SVT.  She reports a bedtime of around 9 PM and rise time between 6 and 7 AM.  She lives with her husband, they have 4 grown kids.  They have 1 dog in the household, she and her husband do not typically sleep in the same bedroom.  She has nocturia about once or twice per average night, denies recurrent nocturnal or morning headaches, is not aware of any family history of sleep apnea.  She has reduced her caffeine secondary to the SVT.  She has had anxiety spells but these are better.  She currently limits her caffeine to 1 cup of coffee per day and decaf tea occasionally.  She is a non-smoker and drinks alcohol rarely.  She does not have a TV in her bedroom.  She is retired, she worked for [employer].  Her weight has been more or less stable.  Her Past Medical History Is Significant For: Past Medical History:  Diagnosis Date   Allergy 09/28/1968   Anemia    Arthritis    back   Diverticulosis 08/2019   Noted on Colonoscopy    GERD (gastroesophageal reflux disease)    History of colon polyps 08/2019   History of COVID-19    tested positive 3/1 no symptoms restested a couple of days later negative results x2   Hypertension    Kidney lesion    Mass of perirectal soft tissue    Thyroid disease     Her Past Surgical History Is Significant For: Past Surgical History:  Procedure Laterality Date   BREAST BIOPSY Left    BREAST CYST ASPIRATION Left    CESAREAN SECTION     COLONOSCOPY  09/10/2019   MASS EXCISION N/A 01/21/2020   Procedure: EXCISION PERIRECTAL CYST, EXCISION OF ANAL CANAL POLYP, HEMORRHOID LIGATION/PEXY;  Surgeon: Michael Boston, MD;  Location: [facility];  Service: General;  Laterality: N/A;   OOPHORECTOMY     left ovary only   RECTAL EXAM UNDER ANESTHESIA N/A 01/21/2020   Procedure: ANAL EXAM UNDER ANESTHESIA;  Surgeon: Michael Boston, MD;  Location: [facility];  Service: General;  Laterality: N/A;   WISDOM TOOTH EXTRACTION      Her Family History Is Significant For: Family History  Problem Relation Age of Onset   Diabetes Mother    Heart disease Father    Mental illness Brother    Breast cancer Maternal Aunt    Lung cancer Maternal Grandmother    Sleep apnea Neg Hx  Her Social History Is Significant For: Social History   Socioeconomic History   Marital status: Married    Spouse name: Not on file   Number of children: Not on file   Years of education: Not on file   Highest education level: Not on file  Occupational History   Not on file  Tobacco Use   Smoking status: Never   Smokeless tobacco: Never   Tobacco comments:    N/A  Vaping Use   Vaping Use: Never used  Substance and Sexual Activity   Alcohol use: Not Currently    Comment: occ   Drug use: No   Sexual activity: Yes    Birth control/protection: None  Other Topics Concern   Not on file  Social History Narrative   Not on file   Social Determinants of Health   Financial  Resource Strain: Not on file  Food Insecurity: Not on file  Transportation Needs: Not on file  Physical Activity: Not on file  Stress: Not on file  Social Connections: Not on file    Her Allergies Are:  Allergies  Allergen Reactions   Erythromycin Other (See Comments)    Hands and feet tingling   Morphine And Related Rash   Penicillin G Palpitations  :   Her Current Medications Are:  Outpatient Encounter Medications as of 11/19/2022  Medication Sig   clonazePAM (KLONOPIN) 0.5 MG tablet Take 1 tablet (0.5 mg total) by mouth 2 (two) times daily as needed for anxiety.   famotidine (PEPCID) 20 MG tablet Take 1 tablet (20 mg total) by mouth as needed for heartburn or indigestion.   lisinopril (ZESTRIL) 10 MG tablet TAKE (1) TABLET BY MOUTH ONCE DAILY.   Multiple Vitamin (MULTIVITAMIN) tablet Take 1 tablet by mouth daily.   SYNTHROID 75 MCG tablet Take 75 mcg by mouth daily.   hydrOXYzine (ATARAX) 10 MG tablet Take 1 tablet (10 mg total) by mouth 3 (three) times daily as needed.   No facility-administered encounter medications on file as of 11/19/2022.  :   Review of Systems:  Out of a complete 14 point review of systems, all are reviewed and negative with the exception of these symptoms as listed below:   Review of Systems  Neurological:        Pt here for sleep consult Pt states snores,hypertension ,SVT  Pt denies headaches ,fatigue,sleep study,CPAP machine     ESS:5 FSS:9    Objective:  Neurological Exam  Physical Exam Physical Examination:   Vitals:   11/19/22 1112  BP: (!) 148/77  Pulse: 79    General Examination: The patient is a very pleasant 57 y.o. female in no acute distress. She appears well-developed and well-nourished and well groomed.   HEENT: Normocephalic, atraumatic, pupils are equal, round and reactive to light, extraocular tracking is good without limitation to gaze excursion or nystagmus noted. Hearing is grossly intact. Face is symmetric with  normal facial animation. Speech is clear with no dysarthria noted. There is no hypophonia. There is no lip, neck/head, jaw or voice tremor. Neck is supple with full range of passive and active motion. There are no carotid bruits on auscultation. Oropharynx exam reveals: mild mouth dryness, good dental hygiene and moderate airway crowding secondary to small airway entry and larger uvula which is elongated as well.  Mallampati is class II, tonsils on the smaller side.  Tongue protrudes centrally and palate elevates symmetrically, neck circumference 15 inches, minimal overbite.    Chest: Clear to auscultation without  wheezing, rhonchi or crackles noted.  Heart: S1+S2+0, regular and normal without murmurs, rubs or gallops noted.   Abdomen: Soft, non-tender and non-distended.  Extremities: There is no pitting edema in the distal lower extremities bilaterally.   Skin: Warm and dry without trophic changes noted.   Musculoskeletal: exam reveals no obvious joint deformities.   Neurologically:  Mental status: The patient is awake, alert and oriented in all 4 spheres. Her immediate and remote memory, attention, language skills and fund of knowledge are appropriate. There is no evidence of aphasia, agnosia, apraxia or anomia. Speech is clear with normal prosody and enunciation. Thought process is linear. Mood is normal and affect is normal.  Cranial nerves II - XII are as described above under HEENT exam.  Motor exam: Normal bulk, strength and tone is noted. There is no obvious action or resting tremor.  Fine motor skills and coordination: grossly intact.  Cerebellar testing: No dysmetria or intention tremor. There is no truncal or gait ataxia.  Sensory exam: intact to light touch in the upper and lower extremities.  Gait, station and balance: She stands easily. No veering to one side is noted. No leaning to one side is noted. Posture is age-appropriate and stance is narrow based. Gait shows normal stride  length and normal pace. No problems turning are noted.   Assessment and Plan:  In summary, OMA MARZAN is a very pleasant 57 y.o.-year old female with an underlying medical history of allergies, arthritis, anemia, diverticulosis, reflux disease, hypertension, thyroid disease, SVT, anxiety, and borderline overweight state, whose history and physical exam are concerning for sleep disordered breathing, supporting a current working diagnosis of unspecified sleep apnea, with the main differential diagnoses of obstructive sleep apnea (OSA) versus upper airway resistance syndrome (UARS) versus central sleep apnea (CSA), or mixed sleep apnea. A laboratory attended sleep study is typically considered "gold standard" for evaluation of sleep disordered breathing.   I had a long chat with the patient about my findings and the diagnosis of sleep apnea, particularly OSA, its prognosis and treatment options. We talked about medical/conservative treatments, surgical interventions and non-pharmacological approaches for symptom control. I explained, in particular, the risks and ramifications of untreated moderate to severe OSA, especially with respect to developing cardiovascular disease down the road, including congestive heart failure (CHF), difficult to treat hypertension, cardiac arrhythmias (particularly A-fib), neurovascular complications including TIA, stroke and dementia. Even type 2 diabetes has, in part, been linked to untreated OSA. Symptoms of untreated OSA may include (but may not be limited to) daytime sleepiness, nocturia (i.e. frequent nighttime urination), memory problems, mood irritability and suboptimally controlled or worsening mood disorder such as depression and/or anxiety, lack of energy, lack of motivation, physical discomfort, as well as recurrent headaches, especially morning or nocturnal headaches. We talked about the importance of maintaining a healthy lifestyle and striving for healthy weight.  In  addition, we talked about the importance of striving for and maintaining good sleep hygiene. I recommended a sleep study at this time. I outlined the differences between a laboratory attended sleep study which is considered more comprehensive and accurate over the option of a home sleep test (HST); the latter may lead to underestimation of sleep disordered breathing in some instances and does not help with diagnosing upper airway resistance syndrome and is not accurate enough to diagnose primary central sleep apnea typically. I outlined possible surgical and non-surgical treatment options of OSA, including the use of a positive airway pressure (PAP) device (i.e. CPAP, AutoPAP/APAP  or BiPAP in certain circumstances), a custom-made dental device (aka oral appliance, which would require a referral to a specialist dentist or orthodontist typically, and is generally speaking not considered for patients with full dentures or edentulous state), upper airway surgical options, such as traditional UPPP (which is not considered a first-line treatment) or the Inspire device (hypoglossal nerve stimulator, which would involve a referral for consultation with an ENT surgeon, after careful selection, following inclusion criteria - also not first-line treatment). I explained the PAP treatment option to the patient in detail, as this is generally considered first-line treatment.  The patient indicated that she would be reluctant but willing to try PAP therapy, if the need arises. I explained the importance of being compliant with PAP treatment, not only for insurance purposes but primarily to improve patient's symptoms symptoms, and for the patient's long term health benefit, including to reduce Her cardiovascular risks longer-term.    We will pick up our discussion about the next steps and treatment options after testing.  We will keep her posted as to the test results by phone call and/or MyChart messaging where possible.  We  will plan to follow-up in sleep clinic accordingly as well.  I answered all her questions today and the patient was in agreement.   I encouraged her to call with any interim questions, concerns, problems or updates or email [place] through [place].  Generally speaking, sleep test authorizations may take up to 2 weeks, sometimes less, sometimes longer, the patient is encouraged to get in touch with [place] if they do not hear back from the sleep lab staff directly within the next 2 weeks.  Thank you very much for allowing me to participate in the care of this nice patient. If I can be of any further assistance to you please do not hesitate to call me at [phone].  Sincerely,   Star Age, MD, PhD

## 2022-12-01 ENCOUNTER — Encounter: Payer: Self-pay | Admitting: Physician Assistant

## 2022-12-02 MED ORDER — FAMOTIDINE 20 MG PO TABS: 20.0000 mg | ORAL_TABLET | ORAL | 3 refills | Status: DC | PRN

## 2022-12-10 ENCOUNTER — Telehealth: Payer: Self-pay | Admitting: Neurology

## 2022-12-10 NOTE — Telephone Encounter (Signed)
[facility] pending faxed notes.

## 2022-12-23 NOTE — Telephone Encounter (Signed)
Called [facility] to check the status. I spoke with the Sam with [facility] and he informed me it is still in pending status and being reviewed.

## 2022-12-30 MED ORDER — LISINOPRIL 10 MG PO TABS: ORAL_TABLET | ORAL | 0 refills | Status: DC

## 2022-12-30 NOTE — Addendum Note (Signed)
Addended by: Marian Sorrow on: 12/30/2022 08:27 AM   Modules accepted: Orders

## 2022-12-31 ENCOUNTER — Ambulatory Visit: Payer: Commercial Managed Care - PPO | Admitting: Physician Assistant

## 2023-01-03 ENCOUNTER — Ambulatory Visit (INDEPENDENT_AMBULATORY_CARE_PROVIDER_SITE_OTHER): Payer: Commercial Managed Care - PPO | Admitting: Physician Assistant

## 2023-01-03 ENCOUNTER — Encounter: Payer: Self-pay | Admitting: Physician Assistant

## 2023-01-03 VITALS — BP 118/70 | HR 76 | Temp 97.5°F | Ht 62.0 in | Wt 152.4 lb

## 2023-01-03 DIAGNOSIS — S46812A Strain of other muscles, fascia and tendons at shoulder and upper arm level, left arm, initial encounter: Secondary | ICD-10-CM | POA: Diagnosis not present

## 2023-01-03 NOTE — Patient Instructions (Signed)
It was great to see you!  Please schedule physical therapy with Selinda Eon on your way out!  Take care,  [name] PA-C

## 2023-01-03 NOTE — Progress Notes (Signed)
Cassandra Warren is a 57 y.o. female here for a new problem.  History of Present Illness:   Chief Complaint  Patient presents with   Back Pain    Pt c/o left trapeze muscle off and on x 6 months, constant x 2 months. Has been stretching and massage but no help. Pt has a fall about 2 months ago.    HPI  Back pain Patient is complaining about left trapezius muscle pain intermittently for 6 months, but has been consistent in the last 2 months. Cassandra Warren states that Cassandra Warren experienced a fall on her side 2 months ago. Patient reports that Cassandra Warren feels tightness when Cassandra Warren takes a deep breath and it causes her to sort of catch her breath at times. Cassandra Warren mentions that Cassandra Warren has hx of scoliosis. Patient managed pain with stretching and massage therapy with some relief. Cassandra Warren occasionally takes ibuprofen for pain. Denies weakness/tingling sensation in arms.  Past Medical History:  Diagnosis Date   Allergy 04-23-1966   Anemia    Arthritis    back   Diverticulosis 08/2019   Noted on Colonoscopy   GERD (gastroesophageal reflux disease)    History of colon polyps 08/2019   History of COVID-19    tested positive 3/1 no symptoms restested a couple of days later negative results x2   Hypertension    Kidney lesion    Mass of perirectal soft tissue    Thyroid disease      Social History   Tobacco Use   Smoking status: Never   Smokeless tobacco: Never   Tobacco comments:    N/A  Vaping Use   Vaping Use: Never used  Substance Use Topics   Alcohol use: Not Currently    Comment: occ   Drug use: No    Past Surgical History:  Procedure Laterality Date   BREAST BIOPSY Left    BREAST CYST ASPIRATION Left    CESAREAN SECTION     COLONOSCOPY  09/10/2019   MASS EXCISION N/A 01/21/2020   Procedure: EXCISION PERIRECTAL CYST, EXCISION OF ANAL CANAL POLYP, HEMORRHOID LIGATION/PEXY;  Surgeon: Michael Boston, MD;  Location: [place];  Service: General;  Laterality: N/A;   OOPHORECTOMY     left ovary  only   RECTAL EXAM UNDER ANESTHESIA N/A 01/21/2020   Procedure: ANAL EXAM UNDER ANESTHESIA;  Surgeon: Michael Boston, MD;  Location: [place];  Service: General;  Laterality: N/A;   WISDOM TOOTH EXTRACTION      Family History  Problem Relation Age of Onset   Diabetes Mother    Heart disease Father    Mental illness Brother    Breast cancer Maternal Aunt    Lung cancer Maternal Grandmother    Sleep apnea Neg Hx     Allergies  Allergen Reactions   Erythromycin Other (See Comments)    Hands and feet tingling   Morphine And Related Rash   Penicillin G Palpitations    Current Medications:   Current Outpatient Medications:    clonazePAM (KLONOPIN) 0.5 MG tablet, Take 1 tablet (0.5 mg total) by mouth 2 (two) times daily as needed for anxiety., Disp: 20 tablet, Rfl: 0   famotidine (PEPCID) 20 MG tablet, Take 1 tablet (20 mg total) by mouth as needed for heartburn or indigestion., Disp: 90 tablet, Rfl: 3   lisinopril (ZESTRIL) 10 MG tablet, TAKE (1) TABLET BY MOUTH ONCE DAILY., Disp: 90 tablet, Rfl: 0   Multiple Vitamin (MULTIVITAMIN) tablet, Take 1 tablet by mouth  daily., Disp: , Rfl:    SYNTHROID 75 MCG tablet, Take 75 mcg by mouth daily., Disp: , Rfl:    Review of Systems:   Review of Systems  Musculoskeletal:  Positive for back pain.    Vitals:   Vitals:   01/03/23 0916  BP: 118/70  Pulse: 76  Temp: (!) 97.5 F (36.4 C)  TempSrc: Temporal  SpO2: 96%  Weight: 152 lb 6.1 oz (69.1 kg)  Height: '5\' 2"'$  (1.575 m)     Body mass index is 27.87 kg/m.  Physical Exam:   Physical Exam Constitutional:      General: Cassandra Warren is not in acute distress.    Appearance: Normal appearance. Cassandra Warren is not ill-appearing.  HENT:     Head: Normocephalic and atraumatic.     Right Ear: External ear normal.     Left Ear: External ear normal.  Eyes:     Extraocular Movements: Extraocular movements intact.     Pupils: Pupils are equal, round, and reactive to light.   Cardiovascular:     Rate and Rhythm: Normal rate and regular rhythm.     Heart sounds: Normal heart sounds. No murmur heard.    No gallop.  Pulmonary:     Effort: Pulmonary effort is normal. No respiratory distress.     Breath sounds: Normal breath sounds. No wheezing or rales.  Musculoskeletal:     Comments: Normal ROM of L and R arm and shoulder TTP to L superior aspect of trapezius muscle No bony cervical spine tenderness  Skin:    General: Skin is warm and dry.  Neurological:     Mental Status: Cassandra Warren is alert and oriented to person, place, and time.  Psychiatric:        Judgment: Judgment normal.     Assessment and Plan:   Strain of left trapezius muscle, initial encounter No red flags Great candidate for PT -- will refer to Jerene Pitch here at out office Follow-up based on results and clinical response  I,Verona Buck,acting as a scribe for [employer], PA.,have documented all relevant documentation on the behalf of [name], PA,as directed by  [name], PA while in the presence of [name], [place].  I, [name], [place], have reviewed all documentation for this visit. The documentation on 01/03/23 for the exam, diagnosis, procedures, and orders are all accurate and complete.  [name], PA-C

## 2023-01-06 NOTE — Telephone Encounter (Signed)
[facility] denied the [employer].  12/31/22 [facility] no auth req ref # Alinda Sierras K EE  03/07 left VM KS

## 2023-01-07 ENCOUNTER — Encounter: Payer: Self-pay | Admitting: Physical Therapy

## 2023-01-07 ENCOUNTER — Ambulatory Visit (INDEPENDENT_AMBULATORY_CARE_PROVIDER_SITE_OTHER): Payer: Commercial Managed Care - PPO | Admitting: Physical Therapy

## 2023-01-07 DIAGNOSIS — M6281 Muscle weakness (generalized): Secondary | ICD-10-CM | POA: Diagnosis not present

## 2023-01-07 DIAGNOSIS — M542 Cervicalgia: Secondary | ICD-10-CM | POA: Diagnosis not present

## 2023-01-07 NOTE — Therapy (Signed)
OUTPATIENT PHYSICAL THERAPY CERVICAL EVALUATION   Patient Name: Cassandra Warren MRN: [id number] DOB:[birth year], 57 y.o., female Today's Date: 01/07/2023  END OF SESSION:  PT End of Session - 01/07/23 1345     Visit Number 1    Number of Visits 12    Date for PT Re-Evaluation 04/01/23    Authorization Type UHC    PT Start Time [id number]    PT Stop Time [id number]    PT Time Calculation (min) 62 min             Past Medical History:  Diagnosis Date   Allergy 08/17/68   Anemia    Arthritis    back   Diverticulosis 08/2019   Noted on Colonoscopy   GERD (gastroesophageal reflux disease)    History of colon polyps 08/2019   History of COVID-19    tested positive 3/1 no symptoms restested a couple of days later negative results x2   Hypertension    Kidney lesion    Mass of perirectal soft tissue    Thyroid disease    Past Surgical History:  Procedure Laterality Date   BREAST BIOPSY Left    BREAST CYST ASPIRATION Left    CESAREAN SECTION     COLONOSCOPY  09/10/2019   MASS EXCISION N/A 01/21/2020   Procedure: EXCISION PERIRECTAL CYST, EXCISION OF ANAL CANAL POLYP, HEMORRHOID LIGATION/PEXY;  Surgeon: Michael Boston, MD;  Location: [place];  Service: General;  Laterality: N/A;   OOPHORECTOMY     left ovary only   RECTAL EXAM UNDER ANESTHESIA N/A 01/21/2020   Procedure: ANAL EXAM UNDER ANESTHESIA;  Surgeon: Michael Boston, MD;  Location: [place];  Service: General;  Laterality: N/A;   Cave EXTRACTION     Patient Active Problem List   Diagnosis Date Noted   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 01/21/2020   Prolapsed internal hemorrhoids, grade 3 01/21/2020   Kidney lesion 09/29/2017   Essential hypertension 05/30/2017   Hypothyroidism 04/12/2017   Low serum ferritin level 04/12/2017   Overweight (BMI 25.0-29.9) 04/12/2017    PCP: [name], PA  REFERRING PROVIDER: [name], PA  REFERRING DIAG: [phone] (ICD-10-CM) -  Strain of left trapezius muscle, initial encounter  THERAPY DIAG:  Muscle weakness (generalized)  Cervicalgia  Rationale for Evaluation and Treatment: Rehabilitation  ONSET DATE: 6 months   SUBJECTIVE:  SUBJECTIVE STATEMENT: States that she has had on/off pain in the left upper trap for 6 months and last 2 months has been worse. States she has avoided [place] workouts for the last couple weeks. States she participates in body pump and different classes at the [facility]. Has tried heat, massage, TENS. States she fell about 2 months and hurt the right side and over the last couple of months it has been more constant/worse. Squeezing shoulders back make it feel better. States coughing and sneezing it will catch her in that area. When she deep breathes she has a pull/tension in her left upper trap. Sleeps on left side and wakes up with stabbing pain on left arm.    PERTINENT HISTORY:  C-section, scoliosis   PAIN:  Are you having pain? Yes: NPRS scale: 6/10 Pain location: left upper trap  Pain description: dull, tightness Aggravating factors: lifting, workout reaching overhead Relieving factors: squeezing shoulder blades, heat  PRECAUTIONS: None  WEIGHT BEARING RESTRICTIONS: No  FALLS:  Has patient fallen in last 6 months? Yes. Number of falls 1   OCCUPATION: retired - Orthoptist, kayak, hiking , working out   [employer]: Independent  PATIENT GOALS: have less pain and get back to body pump - going to [facility] May 1st -    OBJECTIVE:   DIAGNOSTIC FINDINGS:  No current imaging    COGNITION: Overall cognitive status: Within functional limits for tasks assessed  SENSATION: WFL  POSTURE:  Left SB and right rotation of cervical spine and ankles resting in inversion and knees in valgus,  sacral sitting with shoulders elevated and rounded forward.  PALPATION: Increased resting tone throughout cervical musculature and left shoulder girdle.  Tenderness to palpation in left Lat.   CERVICAL ROM: WNL in all directions but tight    UE Measurements Upper Extremity Right 01/07/2023 Left 01/07/2023   A/PROM MMT A/PROM MMT  Shoulder Flexion WFL 4+ WFL 4+  Shoulder Extension      Shoulder Abduction WFL 4+ WFL 4+  Shoulder Adduction      Shoulder Internal Rotation Reaches to T6SP/60 4 Reaches to T12 SP*/30* 4+  Shoulder External Rotation Reaches to T4 SP /90 4- Reaches to T4 SP /75 4  Elbow Flexion      Elbow Extension      Wrist Flexion      Wrist Extension      Wrist Supination      Wrist Pronation      Wrist Ulnar Deviation      Wrist Radial Deviation      Grip Strength NA  NA     (Blank rows = not tested)   * pain Right hand dominant  CERVICAL SPECIAL TESTS:  Spurling's test: Negative Breathing assessment: Pain in left thoracic spine with inhale 75% belly breather 25% chest breather, short exhale, minimal lower rib approximation TODAY'S TREATMENT:  DATE:   01/07/2023  Therapeutic Exercise:  Aerobic: Supine: Scapular protraction x 20 10 second holds bilaterally, cervical rotation AAROM 5 minutes bilateral Prone:  Seated:  Standing: Neuromuscular Re-education: Long exhale breathing 5 minutes, diaphragmatic breathing 5 minutes Manual Therapy: STM to left Lat-tolerated mildly well Therapeutic Activity: Self Care: Trigger Point Dry Needling:  Modalities:   PATIENT EDUCATION:  Education details: on current presentation, on HEP, on self mobilization and how to perform safely with tools patient has at home and where to perform, on rationale behind breathing exercises and intra-abdominal and intrathoracic pressures and POC Person educated:  Patient Education method: Explanation, Demonstration, and Handouts Education comprehension: verbalized understanding   HOME EXERCISE PROGRAM: No MedBridge just above exercises  ASSESSMENT:  CLINICAL IMPRESSION: Patient patient presents with left-sided lower neck pain centralized around upper trap that has been present for at least 6 months but has progressively worsened over the last 2 months.  Patient did have a fall around this time but does not think this is related to her current presentation, no imaging has been done at this time but will consider imaging if symptoms do not improve with physical therapy.  Patient rests in left sidebending and right rotation and cervical spine as if SCM muscle is in spasm.  Patient demonstrates limitations in range of motion, strength, posture and overall function and would greatly benefit from skilled physical therapy to return to prior level of function.  OBJECTIVE IMPAIRMENTS: decreased activity tolerance, decreased ROM, decreased strength, impaired tone, postural dysfunction, and pain.   ACTIVITY LIMITATIONS: carrying, lifting, sleeping, and reach over head  PARTICIPATION LIMITATIONS:  Working out  PERSONAL FACTORS: 1 comorbidity: Fall 2 months ago  are also affecting patient's functional outcome.   REHAB POTENTIAL: Good  CLINICAL DECISION MAKING: Stable/uncomplicated  EVALUATION COMPLEXITY: Low   GOALS: Goals reviewed with patient? yes  SHORT TERM GOALS: Target date: 02/18/2023  Patient will be independent in self management strategies to improve quality of life and functional outcomes. Baseline: New Program Goal status: INITIAL  2.  Patient will report at least 50% improvement in overall symptoms and/or function to demonstrate improved functional mobility Baseline: 0% better Goal status: INITIAL  3.  Patient will be able to sit without head in right rotation and left sidebending to demonstrate improved seated posture Baseline:  Unable Goal status: INITIAL      LONG TERM GOALS: Target date: 04/01/2023   Patient will report at least 75% improvement in overall symptoms and/or function to demonstrate improved functional mobility Baseline: 0% better Goal status: INITIAL  2.  Patient will be able to demonstrate pain-free upper extremity range of motion in all directions Baseline: see above Goal status: INITIAL  3.  Patient will demonstrate at least 60 degrees of left internal rotation to improve ability to reach behind back Baseline: See above Goal status: INITIAL  4.  Patient will be able to participate in all recreational activities without pain or discomfort to return to prior level of function Baseline: Unable Goal status: INITIAL   PLAN:  PT FREQUENCY: 1-2x/week  PT DURATION: 12 weeks  PLANNED INTERVENTIONS: Therapeutic exercises, Therapeutic activity, Neuromuscular re-education, Balance training, Gait training, Patient/Family education, Self Care, Joint mobilization, Joint manipulation, Vestibular training, Canalith repositioning, Orthotic/Fit training, DME instructions, Dry Needling, Electrical stimulation, Spinal manipulation, Spinal mobilization, Cryotherapy, Moist heat, Traction, Ultrasound, Ionotophoresis '4mg'$ /ml Dexamethasone, Manual therapy, and Re-evaluation  PLAN FOR NEXT SESSION: Follow-up with breathing exercises, scapular retraction and protraction, self and PT soft tissue mobilization, cat dog, thoracic  mobility Going to [facility] May 1st  2:55 PM, 01/07/23 Jerene Pitch, DPT Physical Therapy with [facility]

## 2023-01-14 NOTE — Therapy (Unsigned)
OUTPATIENT PHYSICAL THERAPY TREATMENT NOTE   Patient Name: Cassandra Warren MRN: [id number] DOB:January 02, 1966, 57 y.o., female Today's Date: 01/15/2023  PCP: [name], PA   REFERRING PROVIDER: [name], PA  END OF SESSION:   PT End of Session - 01/15/23 0934     Visit Number 2    Number of Visits 12    Date for PT Re-Evaluation 04/01/23    Authorization Type UHC    PT Start Time 0935    PT Stop Time [id number]    PT Time Calculation (min) 40 min             Past Medical History:  Diagnosis Date   Allergy [birth year]1968   Anemia    Arthritis    back   Diverticulosis 08/2019   Noted on Colonoscopy   GERD (gastroesophageal reflux disease)    History of colon polyps 08/2019   History of COVID-19    tested positive 3/1 no symptoms restested a couple of days later negative results x2   Hypertension    Kidney lesion    Mass of perirectal soft tissue    Thyroid disease    Past Surgical History:  Procedure Laterality Date   BREAST BIOPSY Left    BREAST CYST ASPIRATION Left    CESAREAN SECTION     COLONOSCOPY  09/10/2019   MASS EXCISION N/A 01/21/2020   Procedure: EXCISION PERIRECTAL CYST, EXCISION OF ANAL CANAL POLYP, HEMORRHOID LIGATION/PEXY;  Surgeon: Michael Boston, MD;  Location: [place];  Service: General;  Laterality: N/A;   OOPHORECTOMY     left ovary only   RECTAL EXAM UNDER ANESTHESIA N/A 01/21/2020   Procedure: ANAL EXAM UNDER ANESTHESIA;  Surgeon: Michael Boston, MD;  Location: [place];  Service: General;  Laterality: N/A;   Opp EXTRACTION     Patient Active Problem List   Diagnosis Date Noted   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 States that she feels like the exercises are helping.   Eval: States that she has had on/off pain in the left upper trap for 6 months and last 2 months has been worse. States she has avoided [place] workouts for the last couple weeks. States she participates in body pump and different classes at the [facility]. Has tried heat, massage, TENS. States she fell about 2 months and hurt the right side and over the last couple of months it has been more constant/worse. Squeezing shoulders back make it feel better. States coughing and sneezing it will catch her in that area. When she deep breathes she has a pull/tension in her left upper trap. Sleeps on left side and wakes up with stabbing pain on left arm.       PERTINENT HISTORY:  C-section, scoliosis    PAIN:  Are you having pain? Yes: NPRS scale: 5/10 Pain location: left upper trap  Pain description: dull, tightness Aggravating factors: lifting, workout reaching overhead Relieving factors: squeezing shoulder blades, heat   PRECAUTIONS: None   WEIGHT BEARING RESTRICTIONS: No   FALLS:  Has patient fallen in last 6 months? Yes. Number of falls 1     OCCUPATION: retired - Orthoptist, kayak, hiking , working out    [employer]: Independent   PATIENT GOALS: have less pain and get back to body pump - going to [facility] May 1st -      OBJECTIVE:    DIAGNOSTIC FINDINGS:  No current imaging       COGNITION: Overall cognitive status: Within functional limits for tasks  assessed   SENSATION: WFL   POSTURE:  Left SB and right rotation of cervical spine and ankles resting in inversion and knees in valgus, sacral sitting with shoulders elevated and rounded forward.   PALPATION: Increased resting tone throughout cervical musculature and left shoulder girdle.  Tenderness to palpation in left Lat.           CERVICAL ROM: WNL in all directions but tight                UE Measurements       Upper Extremity Right 01/07/2023 Left 01/07/2023    A/PROM MMT A/PROM MMT  Shoulder Flexion WFL 4+ WFL 4+  Shoulder Extension          Shoulder Abduction WFL 4+ WFL 4+  Shoulder Adduction          Shoulder Internal Rotation Reaches to T6SP/60 4 Reaches to T12 SP*/30* 4+  Shoulder External Rotation Reaches to T4 SP /90 4- Reaches to T4 SP /75 4  Elbow Flexion          Elbow Extension          Wrist Flexion          Wrist Extension          Wrist Supination          Wrist Pronation          Wrist Ulnar Deviation          Wrist Radial Deviation          Grip Strength NA   NA                          (Blank rows = not tested)                       *  pain Right hand dominant   CERVICAL SPECIAL TESTS:  Spurling's test: Negative Breathing assessment: Pain in left thoracic spine with inhale 75% belly breather 25% chest breather, short exhale, minimal lower rib approximation TODAY'S TREATMENT:                                                                                                                              DATE:   01/15/2023    Therapeutic Exercise:    Aerobic: Supine:  Prone: quad over ball with breathing - tolerated well 6 minutes    Seated:    Standing: Chin tuck at wall verbal and tactile cues x 20 Neuromuscular Re-education: On posture, equal pressure on sit bones, head position and use of visual cues to improve head position 10 minutes Manual Therapy: STM to left Lat-left shoulder musculature, left shoulder blade mobilizations in all directions,  cupping to left thoracic and lumbar paraspinal and upper trap-tolerated well self Care: Trigger Point Dry Needling:  Modalities:    PATIENT EDUCATION:  Education details: on HEP, on DN Person educated: Patient Education method: Consulting civil engineer, Media planner, and Handouts Education comprehension: verbalized understanding     HOME EXERCISE PROGRAM: [id number]   ASSESSMENT:   CLINICAL IMPRESSION: 01/15/2023 Focused on manual work on this date which was tolerated well. Reported reduced tension but continued to side with head cocked to side with UT active, this improved with visual and verbal cues. Added laying over ball exercise which was tolerated well. Discussed importance of scapular protraction and posture throughout the day. Reduced tension noted end of session. Will continue with current POC as tolerated.   Eval: Patient patient presents with left-sided lower neck pain centralized around upper trap that has been present for at least 6 months but has progressively worsened over the last 2 months.  Patient did have a fall around this time but does not think this is related to her current presentation, no imaging has been done at this time but will consider imaging if symptoms do not improve with physical therapy.  Patient rests in left sidebending and right rotation and cervical spine as if SCM muscle is in spasm.  Patient demonstrates limitations in range of motion, strength, posture and overall function and would greatly benefit from skilled physical therapy to return to prior level of function.   OBJECTIVE IMPAIRMENTS: decreased activity tolerance, decreased ROM, decreased strength, impaired tone, postural dysfunction, and pain.    ACTIVITY LIMITATIONS: carrying, lifting, sleeping, and reach over head   PARTICIPATION LIMITATIONS:  Working out   PERSONAL FACTORS: 1 comorbidity: Fall 2 months ago  are also affecting patient's functional outcome.    REHAB POTENTIAL: Good   CLINICAL  DECISION MAKING: Stable/uncomplicated   EVALUATION COMPLEXITY: Low     GOALS: Goals reviewed with patient? yes   SHORT TERM GOALS: Target date: 02/18/2023  Patient will be independent in self management strategies to improve quality of life and functional outcomes. Baseline: New Program Goal status: INITIAL  2.  Patient will report at least 50% improvement in overall symptoms and/or function to demonstrate improved functional mobility Baseline: 0% better Goal status: INITIAL   3.  Patient will be able to sit without head in right rotation and left sidebending to demonstrate improved seated posture Baseline: Unable Goal status: INITIAL           LONG TERM GOALS: Target date: 04/01/2023    Patient will report at least 75% improvement in overall symptoms and/or function to demonstrate improved functional mobility Baseline: 0% better Goal status: INITIAL   2.  Patient will be able to demonstrate pain-free upper extremity range of motion in all directions Baseline: see above Goal status: INITIAL   3.  Patient will demonstrate at least 60 degrees of left internal rotation to improve ability to reach behind back Baseline: See above Goal status: INITIAL   4.  Patient will be able to participate in all recreational activities without pain or discomfort to return to prior level of function Baseline: Unable Goal status: INITIAL     PLAN:   PT FREQUENCY: 1-2x/week   PT DURATION: 12 weeks   PLANNED INTERVENTIONS: Therapeutic exercises, Therapeutic activity, Neuromuscular re-education, Balance training, Gait training, Patient/Family education, Self Care, Joint mobilization, Joint manipulation, Vestibular training, Canalith repositioning, Orthotic/Fit training, DME instructions, Dry Needling, Electrical stimulation, Spinal manipulation, Spinal mobilization, Cryotherapy, Moist heat, Traction, Ultrasound, Ionotophoresis 4mg /ml Dexamethasone, Manual therapy, and Re-evaluation   PLAN  FOR NEXT SESSION: Follow-up with breathing exercises, scapular retraction and protraction, self and PT soft tissue mobilization, cat dog, thoracic mobility Going to [facility] May 1st   10:47 AM, 01/15/23 Jerene Pitch, DPT Physical Therapy with [facility]

## 2023-01-15 ENCOUNTER — Encounter: Payer: Self-pay | Admitting: Physical Therapy

## 2023-01-15 ENCOUNTER — Ambulatory Visit (INDEPENDENT_AMBULATORY_CARE_PROVIDER_SITE_OTHER): Payer: Commercial Managed Care - PPO | Admitting: Physical Therapy

## 2023-01-15 DIAGNOSIS — M6281 Muscle weakness (generalized): Secondary | ICD-10-CM | POA: Diagnosis not present

## 2023-01-15 DIAGNOSIS — M542 Cervicalgia: Secondary | ICD-10-CM

## 2023-01-16 NOTE — Telephone Encounter (Signed)
LVM for pt to call back to schedule sleep study.   We have attempted to call the patient two times to schedule sleep study.  Patient has been unavailable at the phone numbers we have on file and has not returned our calls.  If patient calls back we will schedule them for their sleep study.  

## 2023-01-20 ENCOUNTER — Encounter: Payer: Self-pay | Admitting: Physical Therapy

## 2023-01-20 ENCOUNTER — Ambulatory Visit (INDEPENDENT_AMBULATORY_CARE_PROVIDER_SITE_OTHER): Payer: Commercial Managed Care - PPO | Admitting: Physical Therapy

## 2023-01-20 DIAGNOSIS — M6281 Muscle weakness (generalized): Secondary | ICD-10-CM

## 2023-01-20 DIAGNOSIS — M542 Cervicalgia: Secondary | ICD-10-CM | POA: Diagnosis not present

## 2023-01-20 NOTE — Therapy (Signed)
OUTPATIENT PHYSICAL THERAPY TREATMENT NOTE   Patient Name: Cassandra Warren MRN: [id number] DOB:03-Oct-1966, 57 y.o., female Today's Date: 01/20/2023  PCP: [name], PA   REFERRING PROVIDER: [name], PA  END OF SESSION:   PT End of Session - 01/20/23 1216     Visit Number 3    Number of Visits 12    Date for PT Re-Evaluation 04/01/23    Authorization Type UHC    PT Start Time 1218    PT Stop Time [id number]    PT Time Calculation (min) 39 min             Past Medical History:  Diagnosis Date   Allergy Feb 28, 1966   Anemia    Arthritis    back   Diverticulosis 08/2019   Noted on Colonoscopy   GERD (gastroesophageal reflux disease)    History of colon polyps 08/2019   History of COVID-19    tested positive 3/1 no symptoms restested a couple of days later negative results x2   Hypertension    Kidney lesion    Mass of perirectal soft tissue    Thyroid disease    Past Surgical History:  Procedure Laterality Date   BREAST BIOPSY Left    BREAST CYST ASPIRATION Left    CESAREAN SECTION     COLONOSCOPY  09/10/2019   MASS EXCISION N/A 01/21/2020   Procedure: EXCISION PERIRECTAL CYST, EXCISION OF ANAL CANAL POLYP, HEMORRHOID LIGATION/PEXY;  Surgeon: Michael Boston, MD;  Location: [place];  Service: General;  Laterality: N/A;   OOPHORECTOMY     left ovary only   RECTAL EXAM UNDER ANESTHESIA N/A 01/21/2020   Procedure: ANAL EXAM UNDER ANESTHESIA;  Surgeon: Michael Boston, MD;  Location: [place];  Service: General;  Laterality: N/A;   [place] EXTRACTION     Patient Active Problem List   Diagnosis Date Noted   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 01/21/2020   Prolapsed internal hemorrhoids, grade 3 01/21/2020   Kidney lesion 09/29/2017   Essential hypertension 05/30/2017   Hypothyroidism 04/12/2017   Low serum ferritin level 04/12/2017   Overweight (BMI 25.0-29.9) 04/12/2017    THERAPY DIAG:  Muscle weakness  (generalized)  Cervicalgia    REFERRING DIAG: [id number] (ICD-10-CM) - Strain of left trapezius muscle, initial encounter   Rationale for Evaluation and Treatment: Rehabilitation   ONSET DATE: 6 months    SUBJECTIVE:  SUBJECTIVE STATEMENT: 01/20/2023  States she feels better and it feels like it is higher up in her shoulders and no longer in one spot. States she got an adjustment and [place] last week and it felt a lot better the next day.  Eval: States that she has had on/off pain in the left upper trap for 6 months and last 2 months has been worse. States she has avoided [place] workouts for the last couple weeks. States she participates in body pump and different classes at the [facility]. Has tried heat, massage, TENS. States she fell about 2 months and hurt the right side and over the last couple of months it has been more constant/worse. Squeezing shoulders back make it feel better. States coughing and sneezing it will catch her in that area. When she deep breathes she has a pull/tension in her left upper trap. Sleeps on left side and wakes up with stabbing pain on left arm.       PERTINENT HISTORY:  C-section, scoliosis    PAIN:  Are you having pain? Yes: NPRS scale: 6/10 Pain location:across back shoulders  Pain description: dull, tightness Aggravating factors: lifting, workout reaching overhead Relieving factors: squeezing shoulder blades, heat   PRECAUTIONS: None   WEIGHT BEARING RESTRICTIONS: No   FALLS:  Has patient fallen in last 6 months? Yes. Number of falls 1     OCCUPATION: retired - Orthoptist, kayak, hiking , working out    [facility]: Independent   PATIENT GOALS: have less pain and get back to body pump - going to [facility] May 1st -      OBJECTIVE:    DIAGNOSTIC FINDINGS:   No current imaging       COGNITION: Overall cognitive status: Within functional limits for tasks assessed   SENSATION: WFL   POSTURE:  Left SB and right rotation of cervical spine and ankles resting in inversion and knees in valgus, sacral sitting with shoulders elevated and rounded forward.   PALPATION: Increased resting tone throughout cervical musculature and left shoulder girdle.  Tenderness to palpation in left Lat.           CERVICAL ROM: WNL in all directions but tight                UE Measurements       Upper Extremity Right 01/07/2023 Left 01/07/2023    A/PROM MMT A/PROM MMT  Shoulder Flexion WFL 4+ WFL 4+  Shoulder Extension          Shoulder Abduction WFL 4+ WFL 4+  Shoulder Adduction          Shoulder Internal Rotation Reaches to T6SP/60 4 Reaches to T12 SP*/30* 4+  Shoulder External Rotation Reaches to T4 SP /90 4- Reaches to T4 SP /75 4  Elbow Flexion          Elbow Extension          Wrist Flexion          Wrist Extension          Wrist Supination          Wrist Pronation          Wrist Ulnar Deviation          Wrist Radial Deviation          Grip Strength NA   NA                          (Blank rows =  not tested)                       * pain Right hand dominant   CERVICAL SPECIAL TESTS:  Spurling's test: Negative Breathing assessment: Pain in left thoracic spine with inhale 75% belly breather 25% chest breather, short exhale, minimal lower rib approximation TODAY'S TREATMENT:                                                                                                                              DATE:   01/20/2023    Therapeutic Exercise:    Aerobic: Supine:  Prone:      Seated:    Standing  Neuromuscular Re-education:  long exhale breathing 10 minutes, prone and supine laterao costal breathing 15 minutes Manual Therapy: PA to t-spine grade II/III - tolerated well, STM to thoracic paraspinals self Care: Trigger Point Dry Needling:   Modalities:    PATIENT EDUCATION:  Education details: on HEP, on rationale behind breahting exercises.  Person educated: Patient Education method: Explanation, Demonstration, and Handouts Education comprehension: verbalized understanding     HOME EXERCISE PROGRAM: [id number]   ASSESSMENT:   CLINICAL IMPRESSION: 01/20/2023 Session focused on manual work and breathing mechanics.  Tolerated well.  Difficulty with lateral lower rib breathing and spasming noted along posterior muscles.  This reduced a little with practice but continued to spasm.  Reduced tension overall noted end of session.  Will continue with breath work and posture as tolerated.  Eval: Patient patient presents with left-sided lower neck pain centralized around upper trap that has been present for at least 6 months but has progressively worsened over the last 2 months.  Patient did have a fall around this time but does not think this is related to her current presentation, no imaging has been done at this time but will consider imaging if symptoms do not improve with physical therapy.  Patient rests in left sidebending and right rotation and cervical spine as if SCM muscle is in spasm.  Patient demonstrates limitations in range of motion, strength, posture and overall function and would greatly benefit from skilled physical therapy to return to prior level of function.   OBJECTIVE IMPAIRMENTS: decreased activity tolerance, decreased ROM, decreased strength, impaired tone, postural dysfunction, and pain.    ACTIVITY LIMITATIONS: carrying, lifting, sleeping, and reach over head   PARTICIPATION LIMITATIONS:  Working out   PERSONAL FACTORS: 1 comorbidity: Fall 2 months ago  are also affecting patient's functional outcome.    REHAB POTENTIAL: Good   CLINICAL DECISION MAKING: Stable/uncomplicated   EVALUATION COMPLEXITY: Low     GOALS: Goals reviewed with patient? yes   SHORT TERM GOALS: Target date: 02/18/2023  Patient  will be independent in self management strategies to improve quality of life and functional outcomes. Baseline: New Program Goal status: INITIAL   2.  Patient will report at least 50% improvement in overall symptoms and/or function to demonstrate improved  functional mobility Baseline: 0% better Goal status: INITIAL   3.  Patient will be able to sit without head in right rotation and left sidebending to demonstrate improved seated posture Baseline: Unable Goal status: INITIAL           LONG TERM GOALS: Target date: 04/01/2023    Patient will report at least 75% improvement in overall symptoms and/or function to demonstrate improved functional mobility Baseline: 0% better Goal status: INITIAL   2.  Patient will be able to demonstrate pain-free upper extremity range of motion in all directions Baseline: see above Goal status: INITIAL   3.  Patient will demonstrate at least 60 degrees of left internal rotation to improve ability to reach behind back Baseline: See above Goal status: INITIAL   4.  Patient will be able to participate in all recreational activities without pain or discomfort to return to prior level of function Baseline: Unable Goal status: INITIAL     PLAN:   PT FREQUENCY: 1-2x/week   PT DURATION: 12 weeks   PLANNED INTERVENTIONS: Therapeutic exercises, Therapeutic activity, Neuromuscular re-education, Balance training, Gait training, Patient/Family education, Self Care, Joint mobilization, Joint manipulation, Vestibular training, Canalith repositioning, Orthotic/Fit training, DME instructions, Dry Needling, Electrical stimulation, Spinal manipulation, Spinal mobilization, Cryotherapy, Moist heat, Traction, Ultrasound, Ionotophoresis 4mg /ml Dexamethasone, Manual therapy, and Re-evaluation   PLAN FOR NEXT SESSION: Follow-up with breathing exercises, scapular retraction and protraction, self and PT soft tissue mobilization, cat dog, thoracic mobility Going to [facility]  May 1st   1:50 PM, 01/20/23 Jerene Pitch, DPT Physical Therapy with [facility]

## 2023-01-28 ENCOUNTER — Ambulatory Visit (INDEPENDENT_AMBULATORY_CARE_PROVIDER_SITE_OTHER): Payer: Commercial Managed Care - PPO | Admitting: Physical Therapy

## 2023-01-28 ENCOUNTER — Encounter: Payer: Self-pay | Admitting: Physical Therapy

## 2023-01-28 DIAGNOSIS — M542 Cervicalgia: Secondary | ICD-10-CM

## 2023-01-28 DIAGNOSIS — M6281 Muscle weakness (generalized): Secondary | ICD-10-CM | POA: Diagnosis not present

## 2023-01-28 NOTE — Therapy (Signed)
OUTPATIENT PHYSICAL THERAPY TREATMENT NOTE   Patient Name: Cassandra Warren MRN: [id number] DOB:Mar 15, 1966, 57 y.o., female Today's Date: 01/28/2023  PCP: [name], PA   REFERRING PROVIDER: [name], PA  END OF SESSION:   PT End of Session - 01/28/23 1353     Visit Number 4    Number of Visits 12    Date for PT Re-Evaluation 04/01/23    Authorization Type UHC    PT Start Time 1355   late to checkin   PT Stop Time 1430    PT Time Calculation (min) 35 min             Past Medical History:  Diagnosis Date   Allergy [birth year] 15, 1966   Anemia    Arthritis    back   Diverticulosis 08/2019   Noted on Colonoscopy   GERD (gastroesophageal reflux disease)    History of colon polyps 08/2019   History of COVID-19    tested positive 3/1 no symptoms restested a couple of days later negative results x2   Hypertension    Kidney lesion    Mass of perirectal soft tissue    Thyroid disease    Past Surgical History:  Procedure Laterality Date   BREAST BIOPSY Left    BREAST CYST ASPIRATION Left    CESAREAN SECTION     COLONOSCOPY  09/10/2019   MASS EXCISION N/A 01/21/2020   Procedure: EXCISION PERIRECTAL CYST, EXCISION OF ANAL CANAL POLYP, HEMORRHOID LIGATION/PEXY;  Surgeon: Michael Boston, MD;  Location: [place];  Service: General;  Laterality: N/A;   OOPHORECTOMY     left ovary only   RECTAL EXAM UNDER ANESTHESIA N/A 01/21/2020   Procedure: ANAL EXAM UNDER ANESTHESIA;  Surgeon: Michael Boston, MD;  Location: [place];  Service: General;  Laterality: N/A;   [place] EXTRACTION     Patient Active Problem List   Diagnosis Date Noted   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 States she has been doing all her breathing exercises throughout the day and is now only having a little bit of an ache. States she does have a slight other issue from her recent fall. States that this is on the right side and it feels like a pinching. Felt good after last session.  Eval: States that she has had on/off pain in the left upper trap for 6 months and last 2 months has been worse. States she has avoided [place] workouts for the last couple weeks. States she participates in body pump and different classes at the [facility]. Has tried heat, massage, TENS. States she fell about 2 months and hurt the right side and over the last couple of months it has been more constant/worse. Squeezing shoulders back make it feel better. States coughing and sneezing it will catch her in that area. When she deep breathes she has a pull/tension in her left upper trap. Sleeps on left side and wakes up with stabbing pain on left arm.       PERTINENT HISTORY:  C-section, scoliosis    PAIN:  Are you having pain? Yes: NPRS scale: 2/10 Pain location:across back shoulders  Pain description: dull, tightness Aggravating factors: lifting, workout reaching overhead Relieving factors: squeezing shoulder blades, heat   PRECAUTIONS: None   WEIGHT BEARING RESTRICTIONS: No   FALLS:  Has patient fallen in last 6 months? Yes. Number of falls 1     OCCUPATION: retired - Orthoptist, kayak, hiking , working out    [facility] [facility]:  Independent   PATIENT GOALS: have less pain and get back to body pump - going to [facility] May 1st -      OBJECTIVE:    DIAGNOSTIC FINDINGS:  No current imaging       COGNITION: Overall cognitive status: Within functional limits for tasks assessed   SENSATION: WFL   POSTURE:  Left SB and right rotation of cervical spine and ankles resting in inversion and knees in valgus, sacral sitting with shoulders elevated and rounded forward.   PALPATION: Increased resting tone throughout cervical musculature and left shoulder girdle.  Tenderness to palpation in left Lat.           CERVICAL ROM: WNL in all directions but tight                UE Measurements       Upper Extremity Right 01/07/2023 Left 01/07/2023    A/PROM MMT A/PROM MMT  Shoulder Flexion WFL 4+ WFL 4+  Shoulder Extension          Shoulder Abduction WFL 4+ WFL 4+  Shoulder Adduction          Shoulder Internal Rotation Reaches to T6SP/60 4 Reaches to T12 SP*/30* 4+  Shoulder External Rotation Reaches to T4 SP /90 4- Reaches to T4 SP /75 4  Elbow Flexion          Elbow Extension          Wrist Flexion          Wrist Extension          Wrist Supination          Wrist Pronation          Wrist Ulnar Deviation          Wrist Radial Deviation          Grip Strength NA   NA                          (  Blank rows = not tested)                       * pain Right hand dominant   CERVICAL SPECIAL TESTS:  Spurling's test: Negative Breathing assessment: Pain in left thoracic spine with inhale 75% belly breather 25% chest breather, short exhale, minimal lower rib approximation TODAY'S TREATMENT:                                                                                                                              DATE:   01/28/2023    Therapeutic Exercise:    Aerobic: Supine: cat cow- 5 minutes - tactile cues, thoracic roll perpendicular to spine at bra line 3 minutes then with shoulder flexion x15, book stretch x15 5"  holds Prone:  shoulder I's 2x10 B    Seated:    Standing  Neuromuscular Re-education:    Manual Therapy: PA to t-spine grade II/III - tolerated well, STM to thoracic paraspinals self Care: Trigger Point Dry Needling:  Modalities:    PATIENT EDUCATION:  Education details: on HEP, on rationale behind breahting exercises.  Person educated: Patient Education method: Explanation, Demonstration, and Handouts Education comprehension: verbalized understanding     HOME EXERCISE PROGRAM: [id number]   ASSESSMENT:   CLINICAL IMPRESSION: 01/28/2023 Overall patient is doing well. Continue to have limitations in thoracic motion which today's session was focused on. Tolerated well. Verbal and tactile cues throughout session. No pain noted end of session. Discussed anatomy and causes/treatment of pinched nerve along with current presentation. Overall patient is doing well and would continue to benefit from skilled PT at this time.   Eval: Patient patient presents with left-sided lower neck pain centralized around upper trap that has been present for at least 6 months but has progressively worsened over the last 2 months.  Patient did have a fall around this time but does not think this is related to her current presentation, no imaging has been done at this time but will consider imaging if symptoms do not improve with physical therapy.  Patient rests in left sidebending and right rotation and cervical spine as if SCM muscle is in spasm.  Patient demonstrates limitations in range of motion, strength, posture and overall function and would greatly benefit from skilled physical therapy to return to prior level of function.   OBJECTIVE IMPAIRMENTS: decreased activity tolerance, decreased ROM, decreased strength, impaired tone, postural dysfunction, and pain.    ACTIVITY LIMITATIONS: carrying, lifting, sleeping, and reach over head   PARTICIPATION LIMITATIONS:  Working out   PERSONAL FACTORS: 1 comorbidity:  Fall 2 months ago  are also affecting patient's functional outcome.    REHAB POTENTIAL: Good   CLINICAL DECISION MAKING: Stable/uncomplicated   EVALUATION COMPLEXITY: Low     GOALS: Goals reviewed with patient? yes   SHORT TERM GOALS: Target date: 02/18/2023  Patient will be independent in self management strategies to improve quality of life  and functional outcomes. Baseline: New Program Goal status: INITIAL   2.  Patient will report at least 50% improvement in overall symptoms and/or function to demonstrate improved functional mobility Baseline: 0% better Goal status: INITIAL   3.  Patient will be able to sit without head in right rotation and left sidebending to demonstrate improved seated posture Baseline: Unable Goal status: INITIAL           LONG TERM GOALS: Target date: 04/01/2023    Patient will report at least 75% improvement in overall symptoms and/or function to demonstrate improved functional mobility Baseline: 0% better Goal status: INITIAL   2.  Patient will be able to demonstrate pain-free upper extremity range of motion in all directions Baseline: see above Goal status: INITIAL   3.  Patient will demonstrate at least 60 degrees of left internal rotation to improve ability to reach behind back Baseline: See above Goal status: INITIAL   4.  Patient will be able to participate in all recreational activities without pain or discomfort to return to prior level of function Baseline: Unable Goal status: INITIAL     PLAN:   PT FREQUENCY: 1-2x/week   PT DURATION: 12 weeks   PLANNED INTERVENTIONS: Therapeutic exercises, Therapeutic activity, Neuromuscular re-education, Balance training, Gait training, Patient/Family education, Self Care, Joint mobilization, Joint manipulation, Vestibular training, Canalith repositioning, Orthotic/Fit training, DME instructions, Dry Needling, Electrical stimulation, Spinal manipulation, Spinal mobilization, Cryotherapy, Moist  heat, Traction, Ultrasound, Ionotophoresis 4mg /ml Dexamethasone, Manual therapy, and Re-evaluation   PLAN FOR NEXT SESSION: thoracic mobility, Follow-up with breathing exercises, scapular retraction and protraction, self and PT soft tissue mobilization, cat dog, thoracic mobility Going to [facility] May 1st   4:25 PM, 01/28/23 Jerene Pitch, DPT Physical Therapy with [facility]

## 2023-01-30 ENCOUNTER — Encounter: Payer: Commercial Managed Care - PPO | Admitting: Physical Therapy

## 2023-02-04 ENCOUNTER — Encounter: Payer: Commercial Managed Care - PPO | Admitting: Physical Therapy

## 2023-02-06 ENCOUNTER — Encounter: Payer: Commercial Managed Care - PPO | Admitting: Physical Therapy

## 2023-02-11 ENCOUNTER — Ambulatory Visit (INDEPENDENT_AMBULATORY_CARE_PROVIDER_SITE_OTHER): Payer: Commercial Managed Care - PPO | Admitting: Physical Therapy

## 2023-02-11 ENCOUNTER — Encounter: Payer: Self-pay | Admitting: Physical Therapy

## 2023-02-11 DIAGNOSIS — M542 Cervicalgia: Secondary | ICD-10-CM | POA: Diagnosis not present

## 2023-02-11 DIAGNOSIS — M6281 Muscle weakness (generalized): Secondary | ICD-10-CM

## 2023-02-11 NOTE — Therapy (Signed)
OUTPATIENT PHYSICAL THERAPY TREATMENT NOTE   Patient Name: Cassandra Warren MRN: [id number] DOB:04/05/1966, 57 y.o., female Today's Date: 02/11/2023  PCP: Jarold Motto, PA   REFERRING PROVIDER: Jarold Motto, PA  END OF SESSION:   PT End of Session - 02/11/23 1435     Visit Number 5    Number of Visits 12    Date for PT Re-Evaluation 04/01/23    Authorization Type [employer]    PT Start Time 1435    PT Stop Time 1513    PT Time Calculation (min) 38 min             Past Medical History:  Diagnosis Date   Allergy 1966-07-15   Anemia    Arthritis    back   Diverticulosis 08/2019   Noted on Colonoscopy   GERD (gastroesophageal reflux disease)    History of colon polyps 08/2019   History of COVID-19    tested positive 3/1 no symptoms restested a couple of days later negative results x2   Hypertension    Kidney lesion    Mass of perirectal soft tissue    Thyroid disease    Past Surgical History:  Procedure Laterality Date   BREAST BIOPSY Left    BREAST CYST ASPIRATION Left    CESAREAN SECTION     COLONOSCOPY  09/10/2019   MASS EXCISION N/A 01/21/2020   Procedure: EXCISION PERIRECTAL CYST, EXCISION OF ANAL CANAL POLYP, HEMORRHOID LIGATION/PEXY;  Surgeon: Karie Soda, MD;  Location: [facility];  Service: General;  Laterality: N/A;   OOPHORECTOMY     left ovary only   RECTAL EXAM UNDER ANESTHESIA N/A 01/21/2020   Procedure: ANAL EXAM UNDER ANESTHESIA;  Surgeon: Karie Soda, MD;  Location: [facility];  Service: General;  Laterality: N/A;   WISDOM TOOTH EXTRACTION     Patient Active Problem List   Diagnosis Date Noted   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 States that she also was camping which may have come back on the left center of mid lower back.   Eval: States that she has had on/off pain in the left upper trap for 6 months and last 2 months has been worse. States she has avoided [place] workouts for the last couple weeks. States she participates in body pump and different classes at the [facility]. Has tried heat, massage, TENS. States she fell about 2 months and hurt the right side and over the last couple of months it has been more constant/worse. Squeezing shoulders back make it feel better. States coughing and sneezing it will catch her in that area. When she deep breathes she has a pull/tension in her left upper trap. Sleeps on left side and wakes up with stabbing pain on left arm.       PERTINENT HISTORY:  C-section, scoliosis    PAIN:  Are you having pain? Yes: NPRS scale: 2/10 Pain location:across back shoulders  Pain description: dull, tightness Aggravating factors: lifting, workout reaching overhead Relieving factors: squeezing shoulder blades, heat   PRECAUTIONS: None   WEIGHT BEARING RESTRICTIONS: No   FALLS:  Has patient fallen in last 6 months? Yes. Number of falls 1     OCCUPATION: retired - Engineer, site, kayak, hiking , working out    [employer]: Independent   PATIENT GOALS: have less pain and get back to body pump - going to [facility] May 1st -      OBJECTIVE:    DIAGNOSTIC FINDINGS:  No current imaging        COGNITION: Overall cognitive status: Within functional limits for tasks assessed   SENSATION: WFL   POSTURE:  Left SB and right rotation of cervical spine and ankles resting in inversion and knees in valgus, sacral sitting with shoulders elevated and rounded forward.   PALPATION: Increased resting tone throughout cervical musculature and left shoulder girdle.  Tenderness to palpation in left Lat.           CERVICAL ROM: WNL in all directions but tight                UE Measurements       Upper Extremity Right 01/07/2023 Left 01/07/2023    A/PROM MMT A/PROM MMT  Shoulder Flexion WFL 4+ WFL 4+  Shoulder Extension          Shoulder Abduction WFL 4+ WFL 4+  Shoulder Adduction          Shoulder Internal Rotation Reaches to T6SP/60 4 Reaches to T12 SP*/30* 4+  Shoulder External Rotation Reaches to T4 SP /90 4- Reaches to T4 SP /75 4  Elbow Flexion          Elbow Extension          Wrist Flexion          Wrist Extension          Wrist Supination          Wrist Pronation          Wrist Ulnar Deviation          Wrist Radial Deviation          Grip Strength NA   NA                          (Blank rows = not tested)                       *  pain Right hand dominant   CERVICAL SPECIAL TESTS:  Spurling's test: Negative Breathing assessment: Pain in left thoracic spine with inhale 75% belly breather 25% chest breather, short exhale, minimal lower rib approximation TODAY'S TREATMENT:                                                                                                                              DATE:   02/11/2023    Therapeutic Exercise:    Aerobic: Supine:  Prone:  shoulder I's 2x10 B, deep breathing over pillow 5 minutes, with cups 4 minutes    Seated: deep breathing with proper upright position 5 minutes    Standing  Neuromuscular Re-education:    Manual Therapy:   STM to thoracic/lumbar paraspinals self Care: Trigger Point Dry Needling:  Modalities:     PATIENT EDUCATION:  Education details: on HEP, on rationale behind breahting exercises., on benefits of acu mat, cupping and infrared hot pack. Person educated: Patient Education method: Explanation, Demonstration, and Handouts Education comprehension: verbalized understanding     HOME EXERCISE PROGRAM: [id number]   ASSESSMENT:   CLINICAL IMPRESSION: 02/11/2023 Reeducated patient on importance of self mobilization as patient continues to have cramping and increased tone in thoracic and lumbar paraspinals.  Tolerated manual work well and reduced cramping noted with breathing.  Added prone breath work which helped improve posterior rib expansion.  Reviewed different modalities for pain relief strategies.  Overall patient doing well and reported less tension and pain end of session.  Will continue with current plan of care as tolerated. Eval: Patient patient presents with left-sided lower neck pain centralized around upper trap that has been present for at least 6 months but has progressively worsened over the last 2 months.  Patient did have a fall around this time but does not think this is related to her current presentation, no imaging has been done at this time but will consider imaging if symptoms do not improve with physical therapy.  Patient rests in left sidebending and right rotation and cervical spine as if SCM muscle is in spasm.  Patient demonstrates limitations in range of motion, strength, posture and overall function and would greatly benefit from skilled physical therapy to return to prior level of function.   OBJECTIVE IMPAIRMENTS: decreased activity tolerance, decreased ROM, decreased strength, impaired tone, postural dysfunction, and pain.    ACTIVITY LIMITATIONS: carrying, lifting, sleeping, and reach over head   PARTICIPATION LIMITATIONS:  Working out   PERSONAL FACTORS: 1 comorbidity: Fall 2 months ago  are also affecting patient's functional outcome.    REHAB POTENTIAL:  Good   CLINICAL DECISION MAKING: Stable/uncomplicated   EVALUATION COMPLEXITY: Low     GOALS: Goals reviewed with patient? yes   SHORT TERM GOALS: Target date: 02/18/2023  Patient will be independent in self management strategies to improve quality of life and functional outcomes. Baseline: New Program Goal status: INITIAL   2.  Patient will report at least 50%  improvement in overall symptoms and/or function to demonstrate improved functional mobility Baseline: 0% better Goal status: INITIAL   3.  Patient will be able to sit without head in right rotation and left sidebending to demonstrate improved seated posture Baseline: Unable Goal status: INITIAL           LONG TERM GOALS: Target date: 04/01/2023    Patient will report at least 75% improvement in overall symptoms and/or function to demonstrate improved functional mobility Baseline: 0% better Goal status: INITIAL   2.  Patient will be able to demonstrate pain-free upper extremity range of motion in all directions Baseline: see above Goal status: INITIAL   3.  Patient will demonstrate at least 60 degrees of left internal rotation to improve ability to reach behind back Baseline: See above Goal status: INITIAL   4.  Patient will be able to participate in all recreational activities without pain or discomfort to return to prior level of function Baseline: Unable Goal status: INITIAL     PLAN:   PT FREQUENCY: 1-2x/week   PT DURATION: 12 weeks   PLANNED INTERVENTIONS: Therapeutic exercises, Therapeutic activity, Neuromuscular re-education, Balance training, Gait training, Patient/Family education, Self Care, Joint mobilization, Joint manipulation, Vestibular training, Canalith repositioning, Orthotic/Fit training, DME instructions, Dry Needling, Electrical stimulation, Spinal manipulation, Spinal mobilization, Cryotherapy, Moist heat, Traction, Ultrasound, Ionotophoresis 4mg /ml Dexamethasone, Manual therapy, and  Re-evaluation   PLAN FOR NEXT SESSION: thoracic mobility, Follow-up with breathing exercises, scapular retraction and protraction, self and PT soft tissue mobilization, cat dog, thoracic mobility Going to [facility] May 1st   3:16 PM, 02/11/23 [name], DPT Physical Therapy with Dol[facility] Lory'

## 2023-02-18 ENCOUNTER — Encounter: Payer: Self-pay | Admitting: Physician Assistant

## 2023-02-18 ENCOUNTER — Ambulatory Visit (INDEPENDENT_AMBULATORY_CARE_PROVIDER_SITE_OTHER): Payer: Commercial Managed Care - PPO | Admitting: Physician Assistant

## 2023-02-18 VITALS — BP 122/80 | HR 78 | Temp 98.0°F | Ht 62.0 in | Wt 154.2 lb

## 2023-02-18 DIAGNOSIS — E039 Hypothyroidism, unspecified: Secondary | ICD-10-CM | POA: Diagnosis not present

## 2023-02-18 DIAGNOSIS — I10 Essential (primary) hypertension: Secondary | ICD-10-CM | POA: Diagnosis not present

## 2023-02-18 DIAGNOSIS — E785 Hyperlipidemia, unspecified: Secondary | ICD-10-CM | POA: Diagnosis not present

## 2023-02-18 DIAGNOSIS — Z0000 Encounter for general adult medical examination without abnormal findings: Secondary | ICD-10-CM

## 2023-02-18 MED ORDER — SYNTHROID 75 MCG PO TABS: 75.0000 ug | ORAL_TABLET | Freq: Every day | ORAL | 1 refills | Status: DC

## 2023-02-18 MED ORDER — LISINOPRIL 10 MG PO TABS: ORAL_TABLET | ORAL | 1 refills | Status: DC

## 2023-02-18 NOTE — Patient Instructions (Signed)
It was great to see you!  Enjoy your travels!  Take care,  Cassandra Warren

## 2023-02-18 NOTE — Progress Notes (Signed)
Subjective:    Cassandra Warren is a 57 y.o. female and is here for a comprehensive physical exam.  HPI  Health Maintenance Due  Topic Date Due   HIV Screening  Never done   Hepatitis C Screening  Never done    Acute Concerns: Cholesterol: We discussed her recent labs and addressed her concerns for her cholesterol levels.  She changed her diet last year and has been eating healthier.  She has been taking Metamucil and started taking fish oil last week.  Lab Results  Component Value Date   CHOL 246 (A) 02/06/2022   HDL 49 02/06/2022   LDLCALC 175 02/06/2022   TRIG 120 02/06/2022    Chronic Issues: Hypertension: Her blood pressure is overall well controlled.  Taking lisinopril 10 mg daily Denies chest pain, SOB, LE swelling  Hypothyroidism: She is compliant with 75 mg Synthroid  Most recent TSH was 1.44 on 02/10/23  Health Maintenance: Immunizations -- UTD Colonoscopy -- Last done 09/10/2019. Diverticulosis.  Mammogram -- 03/06/2022. Results were normal.  PAP -- Last done 01/31/2021. Bone Density -- N/A Diet -- Healthy diet.  Exercise -- regularly exercises  Sleep habits -- No concerns Mood -- Stable  UTD with dentist? - yes UTD with eye doctor? - yes  Weight history: Wt Readings from Last 10 Encounters:  02/18/23 154 lb 4 oz (70 kg)  01/03/23 152 lb 6.1 oz (69.1 kg)  11/19/22 153 lb (69.4 kg)  09/25/22 152 lb (68.9 kg)  08/21/22 149 lb 6.1 oz (67.8 kg)  02/12/22 151 lb 9.6 oz (68.8 kg)  09/25/21 153 lb (69.4 kg)  01/31/21 153 lb 6.4 oz (69.6 kg)  07/28/20 156 lb (70.8 kg)  06/20/20 153 lb (69.4 kg)   Body mass index is 28.21 kg/m. Patient's last menstrual period was 01/28/2023 (approximate).  Alcohol use:  reports that she does not currently use alcohol.  Tobacco use:  Tobacco Use: Low Risk  (02/18/2023)   Patient History    Smoking Tobacco Use: Never    Smokeless Tobacco Use: Never    Passive Exposure: Not on file   Eligible for lung cancer  screening? no     02/18/2023   11:01 AM  Depression screen PHQ 2/9  Decreased Interest 0  Down, Depressed, Hopeless 0  PHQ - 2 Score 0     Other providers/specialists: Patient Care Team: Jarold Motto, [place] as PCP - General (Physician Assistant) Obgyn, Ma Hillock as Consulting Physician (Obstetrics and Gynecology) Karie Soda, MD as Consulting Physician (General Surgery)    PMHx, SurgHx, SocialHx, Medications, and Allergies were reviewed in the Visit Navigator and updated as appropriate.   Past Medical History:  Diagnosis Date   Allergy 1966/06/29   Anemia    Arthritis    back   Diverticulosis 08/2019   Noted on Colonoscopy   GERD (gastroesophageal reflux disease)    History of colon polyps 08/2019   History of COVID-19    tested positive 3/1 no symptoms restested a couple of days later negative results x2   Hypertension    Kidney lesion    Mass of perirectal soft tissue    Thyroid disease      Past Surgical History:  Procedure Laterality Date   BREAST BIOPSY Left    BREAST CYST ASPIRATION Left    CESAREAN SECTION     COLONOSCOPY  09/10/2019   MASS EXCISION N/A 01/21/2020   Procedure: EXCISION PERIRECTAL CYST, EXCISION OF ANAL CANAL POLYP, HEMORRHOID LIGATION/PEXY;  Surgeon: Karie Soda,  MD;  Location: [facility];  Service: General;  Laterality: N/A;   OOPHORECTOMY     left ovary only   RECTAL EXAM UNDER ANESTHESIA N/A 01/21/2020   Procedure: ANAL EXAM UNDER ANESTHESIA;  Surgeon: Karie Soda, MD;  Location:  SURGERY CENTER;  Service: General;  Laterality: N/A;   WISDOM TOOTH EXTRACTION       Family History  Problem Relation Age of Onset   Diabetes Mother    Heart disease Father    Mental illness Brother    Lung cancer Maternal Grandmother    Breast cancer Maternal Aunt    Sleep apnea Neg Hx    Colon cancer Neg Hx     Social History   Tobacco Use   Smoking status: Never   Smokeless tobacco: Never   Tobacco comments:     N/A  Vaping Use   Vaping Use: Never used  Substance Use Topics   Alcohol use: Not Currently    Comment: occ   Drug use: Never    Review of Systems:   ROS  Objective:   BP 122/80 (BP Location: Left Arm, Patient Position: Sitting, Cuff Size: Normal)   Pulse 78   Temp 98 F (36.7 C) (Temporal)   Ht  (1.575 m)   Wt 154 lb 4 oz (70 kg)   LMP 01/28/2023 (Approximate)   SpO2 98%   BMI 28.21 kg/m  Body mass index is 28.21 kg/m.   General Appearance:    Alert, cooperative, no distress, appears stated age  Head:    Normocephalic, without obvious abnormality, atraumatic  Eyes:    PERRL, conjunctiva/corneas clear, EOM's intact, fundi    benign, both eyes  Ears:    Normal TM's and external ear canals, both ears  Nose:   Nares normal, septum midline, mucosa normal, no drainage    or sinus tenderness  Throat:   Lips, mucosa, and tongue normal; teeth and gums normal  Neck:   Supple, symmetrical, trachea midline, no adenopathy;    thyroid:  no enlargement/tenderness/nodules; no carotid   bruit or JVD  Back:     Symmetric, no curvature, ROM normal, no CVA tenderness  Lungs:     Clear to auscultation bilaterally, respirations unlabored  Chest Wall:    No tenderness or deformity   Heart:    Regular rate and rhythm, S1 and S2 normal, no murmur, rub or gallop  Breast Exam:    Deferred  Abdomen:     Soft, non-tender, bowel sounds active all four quadrants,    no masses, no organomegaly  Genitalia:    Deferred  Extremities:   Extremities normal, atraumatic, no cyanosis or edema  Pulses:   2+ and symmetric all extremities  Skin:   Skin color, texture, turgor normal, no rashes or lesions  Lymph nodes:   Cervical, supraclavicular, and axillary nodes normal  Neurologic:   CNII-XII intact, normal strength, sensation and reflexes    throughout    Assessment/Plan:   Routine physical examination Today patient counseled on age appropriate routine health concerns for screening and  prevention, each reviewed and up to date or declined. Immunizations reviewed and up to date or declined. Labs ordered and reviewed. Risk factors for depression reviewed and negative. Hearing function and visual acuity are intact. ADLs screened and addressed as needed. Functional ability and level of safety reviewed and appropriate. Education, counseling and referrals performed based on assessed risks today. Patient provided with a copy of personalized plan for preventive services.  Essential hypertension Normotensive Continue lisinopril 10 mg daily Follow-up in 1 year, sooner if concerns  Hypothyroidism, unspecified type Well controlled Continue Synthroid 75 mcg daily Follow-up in 6 mo, sooner if concerns  Hyperlipidemia, unspecified hyperlipidemia type Reviewed ASCVD of 2.3% Recommend metamucil and ongoing healthy lifestyle Consider calcium score -- discussed, she will consider this   I,Rachel Rivera,acting as a scribe for [employer], PA.,have documented all relevant documentation on the behalf of Jarold Motto, PA,as directed by  Jarold Motto, PA while in the presence of Jarold Motto, [place].  I, Jarold Motto, [place], have reviewed all documentation for this visit. The documentation on 02/18/23 for the exam, diagnosis, procedures, and orders are all accurate and complete.   Jarold Motto, PA-C [facility]

## 2023-02-19 ENCOUNTER — Encounter: Payer: Commercial Managed Care - PPO | Admitting: Physical Therapy

## 2023-02-20 ENCOUNTER — Encounter: Payer: Commercial Managed Care - PPO | Admitting: Physical Therapy

## 2023-02-24 ENCOUNTER — Ambulatory Visit (INDEPENDENT_AMBULATORY_CARE_PROVIDER_SITE_OTHER): Payer: Commercial Managed Care - PPO | Admitting: Physical Therapy

## 2023-02-24 ENCOUNTER — Encounter: Payer: Self-pay | Admitting: Physical Therapy

## 2023-02-24 DIAGNOSIS — M6281 Muscle weakness (generalized): Secondary | ICD-10-CM

## 2023-02-24 DIAGNOSIS — M542 Cervicalgia: Secondary | ICD-10-CM | POA: Diagnosis not present

## 2023-02-24 NOTE — Therapy (Addendum)
OUTPATIENT PHYSICAL THERAPY TREATMENT NOTE PHYSICAL THERAPY DISCHARGE SUMMARY  Visits from Start of Care: 6  Current functional level related to goals / functional outcomes: Could not be reassessed secondary to unplanned discharge   Remaining deficits: Could not be reassessed secondary to unplanned discharge   Education / Equipment: Could not be reassessed secondary to unplanned discharge   Patient agrees to discharge. Patient goals were not met. Patient is being discharged due to not returning since the last visit.  1:33 PM, 06/05/23 Cassandra Warren, DPT Physical Therapy with [facility]    Patient Name: Cassandra Warren MRN: [id number] DOB:[birth year], 57 y.o., female Today's Date: 02/24/2023  PCP: Jarold Motto, PA   REFERRING PROVIDER: Jarold Motto, PA  END OF SESSION:   PT End of Session - 02/24/23 1307     Visit Number 6    Number of Visits 12    Date for PT Re-Evaluation 04/01/23    Authorization Type [employer]    PT Start Time 1307   late to check in   PT Stop Time 1345    PT Time Calculation (min) 38 min             Past Medical History:  Diagnosis Date   Allergy [birth year]   Anemia    Arthritis    back   Diverticulosis 08/2019   Noted on Colonoscopy   GERD (gastroesophageal reflux disease)    History of colon polyps 08/2019   History of COVID-19    tested positive 3/1 no symptoms restested a couple of days later negative results x2   Hypertension    Kidney lesion    Mass of perirectal soft tissue    Thyroid disease    Past Surgical History:  Procedure Laterality Date   BREAST BIOPSY Left    BREAST CYST ASPIRATION Left    CESAREAN SECTION     COLONOSCOPY  09/10/2019   MASS EXCISION N/A 01/21/2020   Procedure: EXCISION PERIRECTAL CYST, EXCISION OF ANAL CANAL POLYP, HEMORRHOID LIGATION/PEXY;  Surgeon: Karie Soda, MD;  Location: [facility];  Service: General;  Laterality: N/A;   OOPHORECTOMY     left ovary only   RECTAL  EXAM UNDER ANESTHESIA N/A 01/21/2020   Procedure: ANAL EXAM UNDER ANESTHESIA;  Surgeon: Karie Soda, MD;  Location: [facility] ;  Service: General;  Laterality: N/A;   WISDOM TOOTH EXTRACTION     Patient Active Problem List   Diagnosis Date Noted   Kidney lesion 09/29/2017   Essential hypertension 05/30/2017   Hypothyroidism 04/12/2017    THERAPY DIAG:  Muscle weakness (generalized)  Cervicalgia    REFERRING DIAG: E95.284X (ICD-10-CM) - Strain of left trapezius muscle, initial encounter   Rationale for Evaluation and Treatment: Rehabilitation   ONSET DATE: 6 months    SUBJECTIVE:  SUBJECTIVE STATEMENT: 02/24/2023  States he neck is better and she only feels a light twinge occasionally. States that she still is having some pain in her low back. States it comes and goes. Body pump and paddling her kayak bothers her.   Eval: States that she has had on/off pain in the left upper trap for 6 months and last 2 months has been worse. States she has avoided [place] workouts for the last couple weeks. States she participates in body pump and different classes at the [facility]. Has tried heat, massage, TENS. States she fell about 2 months and hurt the right side and over the last couple of months it has been more constant/worse. Squeezing shoulders back make it feel better. States coughing and sneezing it will catch her in that area. When she deep breathes she has a pull/tension in her left upper trap. Sleeps on left side and wakes up with stabbing pain on left arm.       PERTINENT HISTORY:  C-section, scoliosis    PAIN:  Are you having pain? Yes: NPRS scale: 2/10 Pain location:across back shoulders  Pain description: dull, tightness Aggravating factors: lifting, workout reaching  overhead Relieving factors: squeezing shoulder blades, heat   PRECAUTIONS: None   WEIGHT BEARING RESTRICTIONS: No   FALLS:  Has patient fallen in last 6 months? Yes. Number of falls 1     OCCUPATION: retired - Engineer, site, kayak, hiking , working out    [employer]: Independent   PATIENT GOALS: have less pain and get back to body pump - going to [facility] May 1st -      OBJECTIVE:    DIAGNOSTIC FINDINGS:  No current imaging       COGNITION: Overall cognitive status: Within functional limits for tasks assessed   SENSATION: WFL   POSTURE:  Left SB and right rotation of cervical spine and ankles resting in inversion and knees in valgus, sacral sitting with shoulders elevated and rounded forward.   PALPATION: Increased resting tone throughout cervical musculature and left shoulder girdle.  Tenderness to palpation in left Lat.           CERVICAL ROM: WNL in all directions but tight                UE Measurements       Upper Extremity Right 01/07/2023 Left 01/07/2023    A/PROM MMT A/PROM MMT  Shoulder Flexion WFL 4+ WFL 4+  Shoulder Extension          Shoulder Abduction WFL 4+ WFL 4+  Shoulder Adduction          Shoulder Internal Rotation Reaches to T6SP/60 4 Reaches to T12 SP*/30* 4+  Shoulder External Rotation Reaches to T4 SP /90 4- Reaches to T4 SP /75 4  Elbow Flexion          Elbow Extension          Wrist Flexion          Wrist Extension          Wrist Supination          Wrist Pronation          Wrist Ulnar Deviation          Wrist Radial Deviation          Grip Strength NA   NA                          (Blank rows =  not tested)                       * pain Right hand dominant   CERVICAL SPECIAL TESTS:  Spurling's test: Negative Breathing assessment: Pain in left thoracic spine with inhale 75% belly breather 25% chest breather, short exhale, minimal lower rib approximation TODAY'S TREATMENT:                                                                                                                               DATE:   02/24/2023    Therapeutic Exercise:    Aerobic: Supine: Quad: thread the needle x5 15" holds B, MYA STRETCH  5 minutes  Prone:      Seated: deep breathing with proper upright position 5 minutes    Standing scapular protraction/chin tucks/shoulder flexion - 15 minutes total, long exhale to reduce thoracic extension 5 minutes  Neuromuscular Re-education:    Manual Therapy:   STM to thoracic/lumbar paraspinals self Care: Trigger Point Dry Needling:  Modalities:    PATIENT EDUCATION:  Education details Person educated: Patient Education method: Programmer, multimedia, Facilities manager, and Handouts Education comprehension: verbalized understanding     HOME EXERCISE PROGRAM: [id number]   ASSESSMENT:   CLINICAL IMPRESSION: 02/24/2023 Session focused on movement training as patient has tendency to hyper extend in thoracic spine and move neck excessively with all motion but especially overhead reaching. Discussed modifications for gym places and patient agreed to place body pump class on hold as she does not like to make modifications in classes. Added new exercises to HEP. Answered all questions and will continue with current POC as iterated.   Eval: Patient patient presents with left-sided lower neck pain centralized around upper trap that has been present for at least 6 months but has progressively worsened over the last 2 months.  Patient did have a fall around this time but does not think this is related to her current presentation, no imaging has been done at this time but will consider imaging if symptoms do not improve with physical therapy.  Patient rests in left sidebending and right rotation and cervical spine as if SCM muscle is in spasm.  Patient demonstrates limitations in range of motion, strength, posture and overall function and would greatly benefit from skilled physical therapy to return to prior level of function.    OBJECTIVE IMPAIRMENTS: decreased activity tolerance, decreased ROM, decreased strength, impaired tone, postural dysfunction, and pain.    ACTIVITY LIMITATIONS: carrying, lifting, sleeping, and reach over head   PARTICIPATION LIMITATIONS:  Working out   PERSONAL FACTORS: 1 comorbidity: Fall 2 months ago  are also affecting patient's functional outcome.    REHAB POTENTIAL: Good   CLINICAL DECISION MAKING: Stable/uncomplicated   EVALUATION COMPLEXITY: Low     GOALS: Goals reviewed with patient? yes   SHORT TERM GOALS: Target date: 02/18/2023  Patient will be independent in self management strategies to improve quality of life and  functional outcomes. Baseline: New Program Goal status: INITIAL   2.  Patient will report at least 50% improvement in overall symptoms and/or function to demonstrate improved functional mobility Baseline: 0% better Goal status: INITIAL   3.  Patient will be able to sit without head in right rotation and left sidebending to demonstrate improved seated posture Baseline: Unable Goal status: INITIAL           LONG TERM GOALS: Target date: 04/01/2023    Patient will report at least 75% improvement in overall symptoms and/or function to demonstrate improved functional mobility Baseline: 0% better Goal status: INITIAL   2.  Patient will be able to demonstrate pain-free upper extremity range of motion in all directions Baseline: see above Goal status: INITIAL   3.  Patient will demonstrate at least 60 degrees of left internal rotation to improve ability to reach behind back Baseline: See above Goal status: INITIAL   4.  Patient will be able to participate in all recreational activities without pain or discomfort to return to prior level of function Baseline: Unable Goal status: INITIAL     PLAN:   PT FREQUENCY: 1-2x/week   PT DURATION: 12 weeks   PLANNED INTERVENTIONS: Therapeutic exercises, Therapeutic activity, Neuromuscular re-education,  Balance training, Gait training, Patient/Family education, Self Care, Joint mobilization, Joint manipulation, Vestibular training, Canalith repositioning, Orthotic/Fit training, DME instructions, Dry Needling, Electrical stimulation, Spinal manipulation, Spinal mobilization, Cryotherapy, Moist heat, Traction, Ultrasound, Ionotophoresis 4mg /ml Dexamethasone, Manual therapy, and Re-evaluation   PLAN FOR NEXT SESSION: thoracic mobility, Follow-up with breathing exercises, scapular retraction and protraction, self and PT soft tissue mobilization, cat dog, thoracic mobility Going to [facility] May 1st   2:04 PM, 02/24/23 Cassandra Warren, DPT Physical Therapy with [facility]

## 2023-02-26 ENCOUNTER — Encounter: Payer: Commercial Managed Care - PPO | Admitting: Physical Therapy

## 2023-02-27 ENCOUNTER — Other Ambulatory Visit: Payer: Self-pay | Admitting: Obstetrics and Gynecology

## 2023-02-27 DIAGNOSIS — N6012 Diffuse cystic mastopathy of left breast: Secondary | ICD-10-CM

## 2023-03-31 ENCOUNTER — Encounter: Payer: Self-pay | Admitting: Physician Assistant

## 2023-03-31 MED ORDER — SYNTHROID 75 MCG PO TABS: 75.0000 ug | ORAL_TABLET | Freq: Every day | ORAL | 1 refills | Status: DC

## 2023-04-04 ENCOUNTER — Ambulatory Visit
Admission: RE | Admit: 2023-04-04 | Discharge: 2023-04-04 | Disposition: A | Discharge status: Home / Self Care | Payer: Commercial Managed Care - PPO | Source: Ambulatory Visit | Attending: Obstetrics and Gynecology | Admitting: Obstetrics and Gynecology

## 2023-04-04 DIAGNOSIS — N6012 Diffuse cystic mastopathy of left breast: Secondary | ICD-10-CM

## 2023-04-17 ENCOUNTER — Other Ambulatory Visit: Payer: Self-pay

## 2023-04-17 MED ORDER — LISINOPRIL 10 MG PO TABS: ORAL_TABLET | ORAL | 1 refills | Status: DC

## 2023-06-17 LAB — COMPREHENSIVE METABOLIC PANEL
Albumin: 4.4 (ref 3.5–5.0)
Calcium: 9.4 (ref 8.7–10.7)
Globulin: 2.8
eGFR: 71

## 2023-06-17 LAB — LIPID PANEL
Cholesterol: 215 — AB (ref 0–200)
HDL: 46 (ref 35–70)
LDL Cholesterol: 144
Triglycerides: 139 (ref 40–160)

## 2023-06-17 LAB — CBC AND DIFFERENTIAL
HCT: 45 (ref 36–46)
Hemoglobin: 15.4 (ref 12.0–16.0)
Neutrophils Absolute: 3.9
Platelets: 208 10*3/uL (ref 150–400)
WBC: 7

## 2023-06-17 LAB — BASIC METABOLIC PANEL
BUN: 17 (ref 4–21)
CO2: 23 — AB (ref 13–22)
Chloride: 105 (ref 99–108)
Creatinine: 1 (ref 0.5–1.1)
Glucose: 83
Potassium: 4.1 mEq/L (ref 3.5–5.1)
Sodium: 144 (ref 137–147)

## 2023-06-17 LAB — HEPATIC FUNCTION PANEL
ALT: 19 U/L (ref 7–35)
AST: 20 (ref 13–35)
Alkaline Phosphatase: 106 (ref 25–125)
Bilirubin, Total: 0.7

## 2023-06-17 LAB — CBC: RBC: 4.86 (ref 3.87–5.11)

## 2023-07-17 ENCOUNTER — Encounter: Payer: Self-pay | Admitting: Physician Assistant

## 2023-07-17 MED ORDER — LISINOPRIL 10 MG PO TABS: ORAL_TABLET | ORAL | 1 refills | Status: DC

## 2023-07-22 ENCOUNTER — Encounter: Payer: Self-pay | Admitting: Physician Assistant

## 2023-07-22 NOTE — Telephone Encounter (Signed)
Please review lab results. Abstracted into the chart.

## 2023-10-16 ENCOUNTER — Encounter: Payer: Self-pay | Admitting: Physician Assistant

## 2023-10-16 MED ORDER — FAMOTIDINE 20 MG PO TABS: 20.0000 mg | ORAL_TABLET | ORAL | 3 refills | Status: DC | PRN

## 2023-10-17 ENCOUNTER — Encounter (HOSPITAL_BASED_OUTPATIENT_CLINIC_OR_DEPARTMENT_OTHER): Payer: Self-pay

## 2023-10-17 ENCOUNTER — Encounter: Payer: Self-pay | Admitting: Physician Assistant

## 2023-10-17 ENCOUNTER — Emergency Department (HOSPITAL_COMMUNITY): Payer: Commercial Managed Care - PPO

## 2023-10-17 ENCOUNTER — Other Ambulatory Visit: Payer: Self-pay

## 2023-10-17 ENCOUNTER — Emergency Department (HOSPITAL_COMMUNITY)
Admission: EM | Admit: 2023-10-17 | Discharge: 2023-10-17 | Discharge status: Against Medical Advice | Payer: Commercial Managed Care - PPO | Attending: Emergency Medicine | Admitting: Emergency Medicine

## 2023-10-17 ENCOUNTER — Emergency Department (HOSPITAL_BASED_OUTPATIENT_CLINIC_OR_DEPARTMENT_OTHER)
Admission: EM | Admit: 2023-10-17 | Discharge: 2023-10-17 | Disposition: A | Discharge status: Home / Self Care | Payer: Commercial Managed Care - PPO | Source: Home / Self Care | Attending: Emergency Medicine | Admitting: Emergency Medicine

## 2023-10-17 ENCOUNTER — Telehealth: Payer: Self-pay | Admitting: Physician Assistant

## 2023-10-17 ENCOUNTER — Encounter (HOSPITAL_COMMUNITY): Payer: Self-pay | Admitting: Emergency Medicine

## 2023-10-17 ENCOUNTER — Emergency Department (HOSPITAL_BASED_OUTPATIENT_CLINIC_OR_DEPARTMENT_OTHER): Payer: Commercial Managed Care - PPO | Admitting: Radiology

## 2023-10-17 DIAGNOSIS — R911 Solitary pulmonary nodule: Secondary | ICD-10-CM | POA: Insufficient documentation

## 2023-10-17 DIAGNOSIS — Z5321 Procedure and treatment not carried out due to patient leaving prior to being seen by health care provider: Secondary | ICD-10-CM | POA: Diagnosis not present

## 2023-10-17 DIAGNOSIS — Z79899 Other long term (current) drug therapy: Secondary | ICD-10-CM | POA: Insufficient documentation

## 2023-10-17 DIAGNOSIS — R002 Palpitations: Secondary | ICD-10-CM | POA: Insufficient documentation

## 2023-10-17 DIAGNOSIS — I10 Essential (primary) hypertension: Secondary | ICD-10-CM | POA: Insufficient documentation

## 2023-10-17 DIAGNOSIS — R7309 Other abnormal glucose: Secondary | ICD-10-CM | POA: Insufficient documentation

## 2023-10-17 LAB — BASIC METABOLIC PANEL
Anion gap: 12 (ref 5–15)
BUN: 17 mg/dL (ref 6–20)
CO2: 20 mmol/L — ABNORMAL LOW (ref 22–32)
Calcium: 8.9 mg/dL (ref 8.9–10.3)
Chloride: 105 mmol/L (ref 98–111)
Creatinine, Ser: 1.07 mg/dL — ABNORMAL HIGH (ref 0.44–1.00)
GFR, Estimated: >60 mL/min (ref >60)
Glucose, Bld: 173 mg/dL — ABNORMAL HIGH (ref 70–99)
Potassium: 3.8 mmol/L (ref 3.5–5.1)
Sodium: 137 mmol/L (ref 135–145)

## 2023-10-17 LAB — CBC
HCT: 42.7 % (ref 36.0–46.0)
Hemoglobin: 14.8 g/dL (ref 12.0–15.0)
MCH: 32.2 pg (ref 26.0–34.0)
MCHC: 34.7 g/dL (ref 30.0–36.0)
MCV: 92.8 fL (ref 80.0–100.0)
Platelets: 194 10*3/uL (ref 150–400)
RBC: 4.6 MIL/uL (ref 3.87–5.11)
RDW: 11.5 % (ref 11.5–15.5)
WBC: 8.6 10*3/uL (ref 4.0–10.5)
nRBC: 0 % (ref 0.0–0.2)

## 2023-10-17 LAB — TROPONIN I (HIGH SENSITIVITY)
Troponin I (High Sensitivity): 4 ng/L (ref <18)
Troponin I (High Sensitivity): 5 ng/L (ref <18)

## 2023-10-17 LAB — HCG, SERUM, QUALITATIVE: Preg, Serum: NEGATIVE

## 2023-10-17 NOTE — Discharge Instructions (Addendum)
Follow-up with your primary care doctor on Monday as scheduled.  You will need to have a CT scan to better assess the pulmonary nodule.  This can be arranged by your primary care doctor.  You also need to have your glucose rechecked.  Return to the emergency room if you have any worsening symptoms.

## 2023-10-17 NOTE — ED Triage Notes (Signed)
Pt was lying on the couch and felt her heart racing with some pressure.  Phone app rated her pulse at 120.  Pt notes tingling to fingers as well . No frank chest pain.  No n/v/d or shob.

## 2023-10-17 NOTE — Telephone Encounter (Unsigned)
Copied from CRM [id number]. Topic: General - Phone/Fax/Address >> Oct 17, 2023  1:48 PM Leavy Cella D wrote: Patient/patient representative is calling for clinic's phone, fax, or address information. >> Oct 17, 2023  1:51 PM Leavy Cella D wrote: Patient stated she received message on MyChart about an appointment opening on Monday but the appointment slot has already been filled and the next available would not be until January 3rd. Patient stated that she will be willing to take any last min cancellation appointments.

## 2023-10-17 NOTE — ED Notes (Signed)
Patient advised she has an appointment next Friday with Dr. Glenetta Hew and is going to leave the ED because of the wait. Pt asked if the ED doc will call her and I said "Typically no, that's why you have to wait to go to the back where they will talk to you". OTF

## 2023-10-17 NOTE — ED Provider Notes (Signed)
[place] EMERGENCY DEPARTMENT AT [facility] Provider Note   CSN: [id number] Arrival date & time: 10/17/23  1712     History  Chief Complaint  Patient presents with   Abnormal ECG    Cassandra Warren is a 57 y.o. female.  Patient is a 57 year old female who presents with palpitations.  She has a history of hypertension, thyroid disease who presents with feeling like her heart was racing.  She said it happened early this morning.  She checked her heart rate and it was in the 100-1 20s.  She felt a little bit lightheaded with it.  She did not have any associated chest pain or discomfort.  She initially went to the [facility] emergency department.  She waited there for several hours until midmorning and had labs done.  She ended up leaving.  She did make a follow-up appointment with her primary care doctor on Monday.  However she said she got a MyChart notification that her EKG result was back from this morning and looked abnormal so she came here to get checked out.  She did say that when she was driving to the hospital this morning she had some tingling in her left side of her face.  It lasted just 1 to 2 minutes.  She has not had any further tingling.  She did not have any speech deficits.  No vision changes.  No numbness or weakness in her extremities.  No ongoing symptoms.  Also of note, she was having palpitations a few years ago and her PCP had arranged for a Holter monitor.  That did show some short episodes of SVT only lasting about 7 beats.  No episodes of atrial fibrillation or V. tach per my chart review.       Home Medications Prior to Admission medications   Medication Sig Start Date End Date Taking? Authorizing Provider  azithromycin (ZITHROMAX) 250 MG tablet Take 250 mg by mouth as directed. 02/17/23   [provider]  clonazePAM (KLONOPIN) 0.5 MG tablet Take 1 tablet (0.5 mg total) by mouth 2 (two) times daily as needed for anxiety. 08/21/22   Jarold Motto, PA  famotidine (PEPCID) 20 MG tablet Take 1 tablet (20 mg total) by mouth as needed for heartburn or indigestion. 10/16/23   Jarold Motto, PA  lisinopril (ZESTRIL) 10 MG tablet TAKE (1) TABLET BY MOUTH ONCE DAILY. 07/17/23   Jarold Motto, PA  Multiple Vitamin (MULTIVITAMIN) tablet Take 1 tablet by mouth daily.    [provider]  SYNTHROID 75 MCG tablet Take 1 tablet (75 mcg total) by mouth daily. 03/31/23   Jarold Motto, PA      Allergies    Erythromycin, Morphine and codeine, and Penicillin g    Review of Systems   Review of Systems  Constitutional:  Negative for chills, diaphoresis, fatigue and fever.  HENT:  Negative for congestion, rhinorrhea and sneezing.   Eyes: Negative.   Respiratory:  Negative for cough, chest tightness and shortness of breath.   Cardiovascular:  Positive for palpitations. Negative for chest pain and leg swelling.  Gastrointestinal:  Negative for abdominal pain, blood in stool, diarrhea, nausea and vomiting.  Genitourinary:  Negative for difficulty urinating, flank pain, frequency and hematuria.  Musculoskeletal:  Negative for arthralgias and back pain.  Skin:  Negative for rash.  Neurological:  Positive for light-headedness. Negative for dizziness, speech difficulty, weakness, numbness and headaches.  Psychiatric/Behavioral:  The patient is nervous/anxious.     Physical Exam Updated  Vital Signs BP (!) 152/79 (BP Location: Right Arm)   Pulse 92   Temp 97.7 F (36.5 C) (Oral)   Resp 18   SpO2 100%  Physical Exam Constitutional:      Appearance: She is well-developed.  HENT:     Head: Normocephalic and atraumatic.  Eyes:     Pupils: Pupils are equal, round, and reactive to light.  Cardiovascular:     Rate and Rhythm: Normal rate and regular rhythm.     Heart sounds: Normal heart sounds.  Pulmonary:     Effort: Pulmonary effort is normal. No respiratory distress.     Breath sounds: Normal breath sounds. No wheezing or  rales.  Chest:     Chest wall: No tenderness.  Abdominal:     General: Bowel sounds are normal.     Palpations: Abdomen is soft.     Tenderness: There is no abdominal tenderness. There is no guarding or rebound.  Musculoskeletal:        General: Normal range of motion.     Cervical back: Normal range of motion and neck supple.     Comments: No edema or calf tenderness  Lymphadenopathy:     Cervical: No cervical adenopathy.  Skin:    General: Skin is warm and dry.     Findings: No rash.  Neurological:     Mental Status: She is alert and oriented to person, place, and time.     ED Results / Procedures / Treatments   Labs (all labs ordered are listed, but only abnormal results are displayed) Labs Reviewed - No data to display  EKG EKG Interpretation Date/Time:  Friday October 17 2023 17:31:27 EST Ventricular Rate:  90 PR Interval:  138 QRS Duration:  76 QT Interval:  350 QTC Calculation: 428 R Axis:   57  Text Interpretation: Normal sinus rhythm with sinus arrhythmia Normal ECG When compared with ECG of 17-Oct-2023 01:44, T wave inversion no longer evident in Inferior leads since last tracing no significant change Confirmed by Rolan Bucco [phone]) on 10/17/2023 5:49:41 PM  Radiology DG Chest 2 View Result Date: 10/17/2023 CLINICAL DATA:  Chest pain EXAM: CHEST - 2 VIEW COMPARISON:  Chest x-ray 09/25/2021 FINDINGS: There is a small hiatal hernia. There is no focal lung infiltrate, pleural effusion or pneumothorax. Cardiomediastinal silhouette is within normal limits. There is a questionable small nodular density in the right lung apex overlying the clavicle. No acute fractures are seen. IMPRESSION: 1. No active cardiopulmonary disease. 2. Questionable small nodular density in the right lung apex overlying the clavicle. Recommend further evaluation with nonemergent chest CT. 3. Small hiatal hernia. Electronically Signed   By: Darliss Cheney M.D.   On: 10/17/2023 03:57     Procedures Procedures    Medications Ordered in ED Medications - No data to display  ED Course/ Medical Decision Making/ A&P                                 Medical Decision Making  Patient is a 57 year old who presents after some episodes of palpitations early this morning.  She has not had any further episodes.  She denies any chest discomfort.  Her EKG does not show any ischemic changes.  I compared it to prior EKGs and I do not see any concerning changes.  I reviewed her chest x-ray from this morning.  There is a small pulmonary nodule but otherwise nothing acute.  No evidence of pneumonia or pulmonary edema.  Labs reviewed and are nonconcerning.  Her glucose is a bit elevated.  She has had 2 negative troponins.  She does not have other symptoms that sound more concerning for other etiologies such as PE.  She is currently asymptomatic.  She was discharged home in good condition.  She has follow-up appointment on Monday with her PCP.  This is in 3 days.  She was advised that she will need to have a CT scan of her chest that can be arranged by her PCP to further characterize the pulmonary nodule.  I also advised her she needs to have her glucose rechecked.  Return precautions were given.  Final Clinical Impression(s) / ED Diagnoses Final diagnoses:  Palpitations  Pulmonary nodule  Elevated glucose    Rx / DC Orders ED Discharge Orders     None         Rolan Bucco, MD 10/17/23 [phone]

## 2023-10-17 NOTE — Telephone Encounter (Signed)
Copied from CRM [id number]. Topic: Clinical - Medical Advice >> Oct 17, 2023  9:47 AM Theodis Sato wrote: Reason for CRM: PT is requesting a call back from [facility] or her nurse as PT would like to go over her test results that she received in the hospital

## 2023-10-17 NOTE — ED Triage Notes (Signed)
Pt c/o abnormal EKG as noted via PCP. Seen at [facility] for racing pulse/ chest pressure. Denies NV, SHOB. Concern r/t EKG reading, requesting repeat workup.

## 2023-10-20 ENCOUNTER — Encounter: Payer: Self-pay | Admitting: Physician Assistant

## 2023-10-20 ENCOUNTER — Other Ambulatory Visit: Payer: Self-pay | Admitting: Physician Assistant

## 2023-10-20 ENCOUNTER — Ambulatory Visit (INDEPENDENT_AMBULATORY_CARE_PROVIDER_SITE_OTHER): Payer: Commercial Managed Care - PPO | Admitting: Family Medicine

## 2023-10-20 ENCOUNTER — Ambulatory Visit: Payer: Commercial Managed Care - PPO | Attending: Physician Assistant

## 2023-10-20 ENCOUNTER — Other Ambulatory Visit (HOSPITAL_BASED_OUTPATIENT_CLINIC_OR_DEPARTMENT_OTHER): Payer: Self-pay

## 2023-10-20 VITALS — BP 160/100 | HR 82 | Temp 97.7°F | Ht 62.0 in | Wt 153.5 lb

## 2023-10-20 DIAGNOSIS — R7309 Other abnormal glucose: Secondary | ICD-10-CM

## 2023-10-20 DIAGNOSIS — R002 Palpitations: Secondary | ICD-10-CM

## 2023-10-20 DIAGNOSIS — R9389 Abnormal findings on diagnostic imaging of other specified body structures: Secondary | ICD-10-CM | POA: Diagnosis not present

## 2023-10-20 DIAGNOSIS — R200 Anesthesia of skin: Secondary | ICD-10-CM | POA: Diagnosis not present

## 2023-10-20 DIAGNOSIS — E785 Hyperlipidemia, unspecified: Secondary | ICD-10-CM

## 2023-10-20 DIAGNOSIS — R42 Dizziness and giddiness: Secondary | ICD-10-CM

## 2023-10-20 DIAGNOSIS — I10 Essential (primary) hypertension: Secondary | ICD-10-CM

## 2023-10-20 DIAGNOSIS — E039 Hypothyroidism, unspecified: Secondary | ICD-10-CM

## 2023-10-20 DIAGNOSIS — R911 Solitary pulmonary nodule: Secondary | ICD-10-CM

## 2023-10-20 DIAGNOSIS — K449 Diaphragmatic hernia without obstruction or gangrene: Secondary | ICD-10-CM

## 2023-10-20 LAB — CBC WITH DIFFERENTIAL/PLATELET
Basophils Absolute: 0.1 10*3/uL (ref 0.0–0.1)
Basophils Relative: 0.6 % (ref 0.0–3.0)
Eosinophils Absolute: 0.3 10*3/uL (ref 0.0–0.7)
Eosinophils Relative: 3.4 % (ref 0.0–5.0)
HCT: 43.3 % (ref 36.0–46.0)
Hemoglobin: 14.9 g/dL (ref 12.0–15.0)
Lymphocytes Relative: 30.4 % (ref 12.0–46.0)
Lymphs Abs: 2.5 10*3/uL (ref 0.7–4.0)
MCHC: 34.3 g/dL (ref 30.0–36.0)
MCV: 94.2 fL (ref 78.0–100.0)
Monocytes Absolute: 0.5 10*3/uL (ref 0.1–1.0)
Monocytes Relative: 6.1 % (ref 3.0–12.0)
Neutro Abs: 4.9 10*3/uL (ref 1.4–7.7)
Neutrophils Relative %: 59.5 % (ref 43.0–77.0)
Platelets: 214 10*3/uL (ref 150.0–400.0)
RBC: 4.6 Mil/uL (ref 3.87–5.11)
RDW: 11.9 % (ref 11.5–15.5)
WBC: 8.3 10*3/uL (ref 4.0–10.5)

## 2023-10-20 LAB — HEMOGLOBIN A1C: Hgb A1c MFr Bld: 5.8 % (ref 4.6–6.5)

## 2023-10-20 LAB — COMPREHENSIVE METABOLIC PANEL
ALT: 17 U/L (ref 0–35)
AST: 19 U/L (ref 0–37)
Albumin: 4.4 g/dL (ref 3.5–5.2)
Alkaline Phosphatase: 99 U/L (ref 39–117)
BUN: 14 mg/dL (ref 6–23)
CO2: 27 meq/L (ref 19–32)
Calcium: 9.3 mg/dL (ref 8.4–10.5)
Chloride: 104 meq/L (ref 96–112)
Creatinine, Ser: 0.93 mg/dL (ref 0.40–1.20)
GFR: 69.29 mL/min (ref >60.00)
Glucose, Bld: 89 mg/dL (ref 70–99)
Potassium: 3.8 meq/L (ref 3.5–5.1)
Sodium: 141 meq/L (ref 135–145)
Total Bilirubin: 1.2 mg/dL (ref 0.2–1.2)
Total Protein: 7.3 g/dL (ref 6.0–8.3)

## 2023-10-20 LAB — LIPID PANEL
Cholesterol: 224 mg/dL — ABNORMAL HIGH (ref 0–200)
HDL: 43.2 mg/dL (ref >39.00)
LDL Cholesterol: 151 mg/dL — ABNORMAL HIGH (ref 0–99)
NonHDL: 180.66
Total CHOL/HDL Ratio: 5
Triglycerides: 150 mg/dL — ABNORMAL HIGH (ref 0.0–149.0)
VLDL: 30 mg/dL (ref 0.0–40.0)

## 2023-10-20 LAB — TSH: TSH: 2.12 u[IU]/mL (ref 0.35–5.50)

## 2023-10-20 MED ORDER — ALPRAZOLAM 1 MG PO TABS: ORAL_TABLET | ORAL | 0 refills | Status: DC

## 2023-10-20 MED ORDER — BLOOD PRESSURE MONITOR MISC
1.0000 | Freq: Once | 0 refills | Status: AC
  Filled 2023-10-20: qty 1, fill #0
  Filled 2023-10-20: qty 1, 30d supply, fill #0

## 2023-10-20 NOTE — Telephone Encounter (Signed)
Copied from CRM [id number]. Topic: Clinical - Lab/Test Results >> Oct 17, 2023  4:03 PM [place] A wrote: Reason for CRM: Patient went to [facility] on [facility] she is worried about the results regarding the EKG.

## 2023-10-20 NOTE — Progress Notes (Signed)
Cassandra Warren is a 57 y.o. female here for a new problem.  History of Present Illness:   Chief Complaint  Patient presents with  . Follow-up    Pt here for ED f/u from 12/20 to discuss results.    HPI  Palpitations Fell asleep on the couch Saturday over the weekend Saturday morning woke up and felt hot flashes, dizziness and increased heart rate up to 124 Felt tingling to left jaw Went to [facility] ER had blood work and EKG  She was tired of waiting so she left [place] She actually came to our office and had one of our providers read her EKG from the ER and it said abnormal -- provider here told her that she could have had a prior MI based on prior EKG  She then started to worry about her symptom(s) and EKG result and went to [facility] ER for evaluation EKG was repeated and she was told normal and she went home  Her blood sugar was increased to 173 at ER -- she reports that when she woke up after the event she drank "a lot" of orange juice.   HTN Currently taking lisinopril 10 mg. At home blood pressure readings are: not checked. Patient denies chest pain, SOB, blurred vision, dizziness, unusual headaches, lower leg swelling. Patient is compliant with medication. Denies excessive caffeine intake, stimulant usage, excessive alcohol intake, or increase in salt consumption.  BP Readings from Last 3 Encounters:  10/20/23 (!) 160/100  10/17/23 (!) 152/79  10/17/23 (!) 143/81    Abnormal chest xray // Hiatal Hernia // Nodular Density In ER she had imaging with the following results: IMPRESSION: 1. No active cardiopulmonary disease. 2. Questionable small nodular density in the right lung apex overlying the clavicle. Recommend further evaluation with nonemergent chest CT. 3. Small hiatal hernia.  She is concerned about the abnormal finding in her chest -- has family history of lung cancer.  Has gastroesophageal reflux disease but Pepcid treats this.   HLD Currently not on  medication Would like her cholesterol rechecked today   Hypothyroidism Currently taking Synthroid 75 mcg daily She is compliant with medication       Past Medical History:  Diagnosis Date  . Allergy 04/12/1968  . Anemia   . Arthritis    back  . Diverticulosis 08/2019   Noted on Colonoscopy  . GERD (gastroesophageal reflux disease)   . History of colon polyps 08/2019  . History of COVID-19    tested positive 3/1 no symptoms restested a couple of days later negative results x2  . Hypertension   . Kidney lesion   . Mass of perirectal soft tissue   . Thyroid disease      Social History   Tobacco Use  . Smoking status: Never  . Smokeless tobacco: Never  . Tobacco comments:    N/A  Vaping Use  . Vaping status: Never Used  Substance Use Topics  . Alcohol use: Not Currently    Comment: occ  . Drug use: Never    Past Surgical History:  Procedure Laterality Date  . BREAST BIOPSY Left 02/2021  . BREAST CYST ASPIRATION Left   . CESAREAN SECTION    . COLONOSCOPY  09/10/2019  . MASS EXCISION N/A 01/21/2020   Procedure: EXCISION PERIRECTAL CYST, EXCISION OF ANAL CANAL POLYP, HEMORRHOID LIGATION/PEXY;  Surgeon: Karie Soda, MD;  Location: [facility];  Service: General;  Laterality: N/A;  . OOPHORECTOMY     left ovary only  .  RECTAL EXAM UNDER ANESTHESIA N/A 01/21/2020   Procedure: ANAL EXAM UNDER ANESTHESIA;  Surgeon: Karie Soda, MD;  Location: [facility];  Service: General;  Laterality: N/A;  . WISDOM TOOTH EXTRACTION      Family History  Problem Relation Age of Onset  . Diabetes Mother   . Heart disease Father   . Breast cancer Paternal Aunt   . Lung cancer Maternal Grandmother   . Mental illness Brother   . Anxiety disorder Brother   . Sleep apnea Neg Hx   . Colon cancer Neg Hx     Allergies  Allergen Reactions  . Erythromycin Other (See Comments)    Hands and feet tingling  . Morphine And Codeine Rash  . Penicillin  G Palpitations    Current Medications:   Current Outpatient Medications:  .  ALPRAZolam (XANAX) 1 MG tablet, Take 1 mg 30 minutes prior to procedure, may take up to 1 additional tablet if needed at start of procedure., Disp: 2 tablet, Rfl: 0 .  Blood Pressure Monitor MISC, 1 each by Does not apply route once for 1 dose., Disp: 1 each, Rfl: 0 .  clonazePAM (KLONOPIN) 0.5 MG tablet, Take 1 tablet (0.5 mg total) by mouth 2 (two) times daily as needed for anxiety., Disp: 20 tablet, Rfl: 0 .  famotidine (PEPCID) 20 MG tablet, Take 1 tablet (20 mg total) by mouth as needed for heartburn or indigestion., Disp: 90 tablet, Rfl: 3 .  lisinopril (ZESTRIL) 10 MG tablet, TAKE (1) TABLET BY MOUTH ONCE DAILY., Disp: 90 tablet, Rfl: 1 .  SYNTHROID 75 MCG tablet, TAKE 1 TABLET DAILY, Disp: 90 tablet, Rfl: 1   Review of Systems:   ROS Negative unless otherwise specified per HPI.   Vitals:   Vitals:   10/20/23 1225 10/20/23 1312  BP: (!) 150/100 (!) 160/100  Pulse: 82   Temp: 97.7 F (36.5 C)   TempSrc: Temporal   SpO2: 98%   Weight: 153 lb 8 oz (69.6 kg)   Height: 5\' 2"  (1.575 m)      Body mass index is 28.08 kg/m.  Physical Exam:   Physical Exam Vitals and nursing note reviewed.  Constitutional:      General: She is not in acute distress.    Appearance: She is well-developed. She is not ill-appearing or toxic-appearing.  Cardiovascular:     Rate and Rhythm: Normal rate and regular rhythm.     Pulses: Normal pulses.     Heart sounds: Normal heart sounds, S1 normal and S2 normal.  Pulmonary:     Effort: Pulmonary effort is normal.     Breath sounds: Normal breath sounds.  Skin:    General: Skin is warm and dry.  Neurological:     General: No focal deficit present.     Mental Status: She is alert.     GCS: GCS eye subscore is 4. GCS verbal subscore is 5. GCS motor subscore is 6.     Cranial Nerves: Cranial nerves 2-12 are intact.     Sensory: Sensation is intact.     Motor: Motor  function is intact.     Coordination: Coordination is intact.     Gait: Gait is intact.  Psychiatric:        Speech: Speech normal.        Behavior: Behavior normal. Behavior is cooperative.     Assessment and Plan:   Left sided numbness Due to transient episode of dizziness, palpitations and left sided facial  numbness will workup as TIA. Will order MRI of brain, CTA of head and neck to rule out stroke, aneurysm, mass, lesion Will order echocardiogram and Zio patch Recommend close monitoring of blood pressure -she is reluctant to increase her lisinopril today because she feels her blood pressure is elevated due to her anxiety and not her heart --I have sent in a blood pressure cuff for her to use to keep a close eye on her blood pressure and instructed her to increase her lisinopril to 20 mg daily if readings remain elevated at home If any new or worsening symptoms, she was instructed to go to the emergency room We also discussed starting low-dose aspirin, she does not want to do this at this time  Elevated glucose Likely related to increase in sugary drink prior to ER Will order A1c to further evaluate  Hypothyroidism, unspecified type Update TSH and adjust 75 mcg Synthroid daily  Abnormal chest x-ray Will order CT scan for further evaluation of abnormal finding to rule out mass She is requesting this to be urgent  Hyperlipidemia, unspecified hyperlipidemia type Update lipid panel and provide recommendations accordingly She is requesting a calcium score, due to the other additional testing that we are ordering we are going to order this but not as a priority  Hiatal hernia Symptoms are overall well-controlled Continue famotidine 20 mg as needed  Essential hypertension Above goal today No evidence of end-organ damage on my exam Recommend patient monitor home blood pressure at least a few times weekly Continue lisinopril 10 mg daily If home monitoring shows consistent  elevation, or any symptom(s) develop, recommend reach out to [place] for further advice on next steps  I, Isabelle Course, acting as a Neurosurgeon for Jarold Motto, [place]., have documented all relevant documentation on the behalf of Jarold Motto, [place], as directed by  Jarold Motto, PA while in the presence of Jarold Motto, [place].  I, Jarold Motto, [place], have reviewed all documentation for this visit. The documentation on 10/20/23 for the exam, diagnosis, procedures, and orders are all accurate and complete.  I spent a total of 102 minutes on this visit, today 10/20/23, which included reviewing previous notes from ER, ordering tests, discussing plan of care with patient and using shared-decision making on next steps, refilling medications, and documenting the findings in the note.    Jarold Motto, PA-C

## 2023-10-20 NOTE — Telephone Encounter (Signed)
Pt is scheduled to discuss results.

## 2023-10-20 NOTE — Progress Notes (Unsigned)
EP to read

## 2023-10-20 NOTE — Patient Instructions (Signed)
It was great to see you!  I will order MRI of brain, CT angiogram of head and neck, CT scan of your chest  Calcium score is not done urgently -- I will place order for routine testing of this, echocardiogram and repeat zio patch  Message me if your blood pressure stays elevated and we will increase your medication accordingly  If new/worsening symptom(s) in the meantime, please go to the ER  Take care,  Jarold Motto PA-C

## 2023-10-21 ENCOUNTER — Ambulatory Visit (HOSPITAL_BASED_OUTPATIENT_CLINIC_OR_DEPARTMENT_OTHER)
Admission: RE | Admit: 2023-10-21 | Discharge: 2023-10-21 | Disposition: A | Discharge status: Home / Self Care | Payer: Self-pay | Source: Ambulatory Visit | Attending: Physician Assistant | Admitting: Physician Assistant

## 2023-10-21 DIAGNOSIS — E785 Hyperlipidemia, unspecified: Secondary | ICD-10-CM | POA: Insufficient documentation

## 2023-10-22 ENCOUNTER — Other Ambulatory Visit: Payer: Self-pay

## 2023-10-22 ENCOUNTER — Emergency Department (HOSPITAL_BASED_OUTPATIENT_CLINIC_OR_DEPARTMENT_OTHER)
Admission: EM | Admit: 2023-10-22 | Discharge: 2023-10-22 | Disposition: A | Discharge status: Home / Self Care | Payer: Commercial Managed Care - PPO | Attending: Emergency Medicine | Admitting: Emergency Medicine

## 2023-10-22 DIAGNOSIS — E039 Hypothyroidism, unspecified: Secondary | ICD-10-CM | POA: Insufficient documentation

## 2023-10-22 DIAGNOSIS — R002 Palpitations: Secondary | ICD-10-CM | POA: Diagnosis present

## 2023-10-22 DIAGNOSIS — I10 Essential (primary) hypertension: Secondary | ICD-10-CM | POA: Diagnosis not present

## 2023-10-22 DIAGNOSIS — R0789 Other chest pain: Secondary | ICD-10-CM | POA: Diagnosis not present

## 2023-10-22 DIAGNOSIS — Z79899 Other long term (current) drug therapy: Secondary | ICD-10-CM | POA: Insufficient documentation

## 2023-10-22 LAB — CBC WITH DIFFERENTIAL/PLATELET
Abs Immature Granulocytes: 0.02 10*3/uL (ref 0.00–0.07)
Basophils Absolute: 0.1 10*3/uL (ref 0.0–0.1)
Basophils Relative: 1 %
Eosinophils Absolute: 0.6 10*3/uL — ABNORMAL HIGH (ref 0.0–0.5)
Eosinophils Relative: 7 %
HCT: 41.4 % (ref 36.0–46.0)
Hemoglobin: 14.6 g/dL (ref 12.0–15.0)
Immature Granulocytes: 0 %
Lymphocytes Relative: 41 %
Lymphs Abs: 3.5 10*3/uL (ref 0.7–4.0)
MCH: 32.2 pg (ref 26.0–34.0)
MCHC: 35.3 g/dL (ref 30.0–36.0)
MCV: 91.2 fL (ref 80.0–100.0)
Monocytes Absolute: 0.5 10*3/uL (ref 0.1–1.0)
Monocytes Relative: 6 %
Neutro Abs: 3.7 10*3/uL (ref 1.7–7.7)
Neutrophils Relative %: 45 %
Platelets: 222 10*3/uL (ref 150–400)
RBC: 4.54 MIL/uL (ref 3.87–5.11)
RDW: 11.6 % (ref 11.5–15.5)
WBC: 8.4 10*3/uL (ref 4.0–10.5)
nRBC: 0 % (ref 0.0–0.2)

## 2023-10-22 LAB — BASIC METABOLIC PANEL
Anion gap: 9 (ref 5–15)
BUN: 19 mg/dL (ref 6–20)
CO2: 26 mmol/L (ref 22–32)
Calcium: 9.2 mg/dL (ref 8.9–10.3)
Chloride: 104 mmol/L (ref 98–111)
Creatinine, Ser: 0.88 mg/dL (ref 0.44–1.00)
GFR, Estimated: >60 mL/min (ref >60)
Glucose, Bld: 114 mg/dL — ABNORMAL HIGH (ref 70–99)
Potassium: 3.9 mmol/L (ref 3.5–5.1)
Sodium: 139 mmol/L (ref 135–145)

## 2023-10-22 LAB — TROPONIN I (HIGH SENSITIVITY): Troponin I (High Sensitivity): 4 ng/L (ref <18)

## 2023-10-22 NOTE — ED Triage Notes (Signed)
Pt POV reporting heart palpitations and SOB that began an hour ago while she was in bed on her phone. Hx SVT, seen on Friday in ED for same, was not cardioverted.

## 2023-10-22 NOTE — ED Provider Notes (Signed)
[place] EMERGENCY DEPARTMENT AT [facility] Provider Note   CSN: [id number] Arrival date & time: 10/22/23  0100     History  Chief Complaint  Patient presents with   Palpitations    Cassandra Warren is a 57 y.o. female.  Patient is a 57 year old female with history of hypothyroidism, hypertension, and nonsustained SVT.  Patient presenting today for evaluation of palpitations and chest tightness.  She was lying in bed looking at her phone when symptoms began.  She describes a squeezing pain deep within her chest, then noted that she was having a racing heart, then comes for evaluation.  Patient was here 5 days ago with similar complaints and had a negative workup.  She was seen in follow-up by her primary doctor who ordered a calcium scoring test.  This was performed this morning and was 0.  The history is provided by the patient.       Home Medications Prior to Admission medications   Medication Sig Start Date End Date Taking? Authorizing Provider  ALPRAZolam Prudy Feeler) 1 MG tablet Take 1 mg 30 minutes prior to procedure, may take up to 1 additional tablet if needed at start of procedure. 10/20/23   Jarold Motto, PA  clonazePAM (KLONOPIN) 0.5 MG tablet Take 1 tablet (0.5 mg total) by mouth 2 (two) times daily as needed for anxiety. 08/21/22   Jarold Motto, PA  famotidine (PEPCID) 20 MG tablet Take 1 tablet (20 mg total) by mouth as needed for heartburn or indigestion. 10/16/23   Jarold Motto, PA  lisinopril (ZESTRIL) 10 MG tablet TAKE (1) TABLET BY MOUTH ONCE DAILY. 07/17/23   Jarold Motto, PA  SYNTHROID 75 MCG tablet TAKE 1 TABLET DAILY 10/20/23   Jarold Motto, PA      Allergies    Erythromycin, Morphine and codeine, and Penicillin g    Review of Systems   Review of Systems  All other systems reviewed and are negative.   Physical Exam Updated Vital Signs BP (!) 162/102   Pulse 97   Temp (!) 97.5 F (36.4 C) (Oral)   Resp 16   SpO2 100%   Physical Exam Vitals and nursing note reviewed.  Constitutional:      General: She is not in acute distress.    Appearance: She is well-developed. She is not diaphoretic.  HENT:     Head: Normocephalic and atraumatic.  Cardiovascular:     Rate and Rhythm: Normal rate and regular rhythm.     Heart sounds: No murmur heard.    No friction rub. No gallop.  Pulmonary:     Effort: Pulmonary effort is normal. No respiratory distress.     Breath sounds: Normal breath sounds. No wheezing.  Abdominal:     General: Bowel sounds are normal. There is no distension.     Palpations: Abdomen is soft.     Tenderness: There is no abdominal tenderness.  Musculoskeletal:        General: No swelling or tenderness. Normal range of motion.     Cervical back: Normal range of motion and neck supple.     Right lower leg: No edema.     Left lower leg: No edema.  Skin:    General: Skin is warm and dry.  Neurological:     General: No focal deficit present.     Mental Status: She is alert and oriented to person, place, and time.     ED Results / Procedures / Treatments   Labs (all labs  ordered are listed, but only abnormal results are displayed) Labs Reviewed - No data to display  EKG ED ECG REPORT   Date: 10/22/2023  Rate: 96  Rhythm: normal sinus rhythm  QRS Axis: normal  Intervals: normal  ST/T Wave abnormalities: normal  Conduction Disutrbances:none  Narrative Interpretation:   Old EKG Reviewed: unchanged  I have personally reviewed the EKG tracing and agree with the computerized printout as noted.   Radiology CT CARDIAC SCORING (SELF PAY ONLY) Addendum Date: 10/21/2023 ADDENDUM REPORT: 10/21/2023 21:25 EXAM: OVER-READ INTERPRETATION  CT CHEST The following report is an over-read performed by radiologist Dr. Curly Shores [facility] on 10/21/2023. This over-read does not include interpretation of cardiac or coronary anatomy or pathology. The coronary calcium score  interpretation by the cardiologist is attached. COMPARISON:  None. FINDINGS: Cardiovascular:  See findings discussed in the body of the report. Mediastinum/Nodes: No suspicious adenopathy identified. Imaged mediastinal structures are unremarkable. Lungs/Pleura: Imaged lungs are clear. No pleural effusion or pneumothorax. Upper Abdomen: Moderate-sized hiatal hernia. Musculoskeletal: No chest wall abnormality. No acute osseous findings. IMPRESSION: Hiatal hernia. Electronically Signed   By: Layla Maw M.D.   On: 10/21/2023 21:25   Result Date: 10/21/2023 CLINICAL DATA:  Cardiovascular Disease Risk stratification EXAM: Coronary Calcium Score TECHNIQUE: A gated, non-contrast computed tomography scan of the heart was performed using 3mm slice thickness. Axial images were analyzed on a dedicated workstation. Calcium scoring of the coronary arteries was performed using the Agatston method. FINDINGS: Coronary arteries: Normal origins. Coronary Calcium Score: Left main: 0 Left anterior descending artery: 0 Left circumflex artery: 0 Right coronary artery: 0 Total: 0 Percentile: <1 Pericardium: Normal. Ascending Aorta: Normal caliber. Hiatal Hernia Non-cardiac: See separate report from [facility]. IMPRESSION: 1. Coronary calcium score of 0. This was <1 percentile for age-, race-, and sex-matched controls. 2. Hiatal Hernia RECOMMENDATIONS: Coronary artery calcium (CAC) score is a strong predictor of incident coronary heart disease (CHD) and provides predictive information beyond traditional risk factors. CAC scoring is reasonable to use in the decision to withhold, postpone, or initiate statin therapy in intermediate-risk or selected borderline-risk asymptomatic adults (age 38-75 years and LDL-C >=70 to <190 mg/dL) who do not have diabetes or established atherosclerotic cardiovascular disease (ASCVD).* In intermediate-risk (10-year ASCVD risk >=7.5% to <20%) adults or selected borderline-risk (10-year ASCVD  risk >=5% to <7.5%) adults in whom a CAC score is measured for the purpose of making a treatment decision the following recommendations have been made: If CAC=0, it is reasonable to withhold statin therapy and reassess in 5 to 10 years, as long as higher risk conditions are absent (diabetes mellitus, family history of premature CHD in first degree relatives (males <55 years; females <65 years), cigarette smoking, or LDL >=190 mg/dL). If CAC is 1 to 99, it is reasonable to initiate statin therapy for patients >=77 years of age. If CAC is >=100 or >=75th percentile, it is reasonable to initiate statin therapy at any age. Cardiology referral should be considered for patients with CAC scores >=400 or >=75th percentile. *2018 AHA/ACC/AACVPR/AAPA/ABC/ACPM/ADA/AGS/APhA/ASPC/NLA/PCNA Guideline on the Management of Blood Cholesterol: A Report of the American College of Cardiology/American Heart Association Task Force on Clinical Practice Guidelines. J Am Coll Cardiol. 2019;73(24):3168-3209. Electronically Signed: By: Donato Schultz M.D. On: 10/21/2023 14:01    Procedures Procedures    Medications Ordered in ED Medications - No data to display  ED Course/ Medical Decision Making/ A&P  Patient is a 57 year old female presenting with palpitations and chest tightness as  described in the HPI.  Patient arrives here with stable vital signs and is afebrile physical examination basically unremarkable.  Workup initiated including CBC, metabolic panel, and troponin, all of which are unremarkable.  I have reviewed the patient's chart.  She has a calcium scoring of 0 making ischemia exceedingly unlikely.  She reports palpitations, but I have witnessed no ectopy throughout her emergency department course.  Her primary doctor is making arrangements for what sounds like a Zio patch and I agree with this course of action.  Patient to follow-up with primary care physician as an outpatient.  Final Clinical Impression(s) / ED  Diagnoses Final diagnoses:  None    Rx / DC Orders ED Discharge Orders     None         Geoffery Lyons, MD 10/22/23 [phone]

## 2023-10-22 NOTE — Discharge Instructions (Signed)
Follow-up with your primary doctor in the next few days, and return to the ER if symptoms significantly worsen or change.

## 2023-10-24 ENCOUNTER — Ambulatory Visit (HOSPITAL_BASED_OUTPATIENT_CLINIC_OR_DEPARTMENT_OTHER): Payer: Commercial Managed Care - PPO

## 2023-10-24 ENCOUNTER — Inpatient Hospital Stay: Payer: Commercial Managed Care - PPO | Admitting: Internal Medicine

## 2023-10-25 ENCOUNTER — Ambulatory Visit (HOSPITAL_BASED_OUTPATIENT_CLINIC_OR_DEPARTMENT_OTHER)
Admission: RE | Admit: 2023-10-25 | Discharge: 2023-10-25 | Disposition: A | Discharge status: Home / Self Care | Payer: Commercial Managed Care - PPO | Source: Ambulatory Visit | Attending: Physician Assistant | Admitting: Physician Assistant

## 2023-10-25 ENCOUNTER — Other Ambulatory Visit (HOSPITAL_BASED_OUTPATIENT_CLINIC_OR_DEPARTMENT_OTHER): Payer: Commercial Managed Care - PPO

## 2023-10-25 DIAGNOSIS — R200 Anesthesia of skin: Secondary | ICD-10-CM | POA: Diagnosis present

## 2023-10-25 DIAGNOSIS — R9389 Abnormal findings on diagnostic imaging of other specified body structures: Secondary | ICD-10-CM | POA: Insufficient documentation

## 2023-10-25 MED ORDER — IOHEXOL 350 MG/ML SOLN
100.0000 mL | Freq: Once | INTRAVENOUS | Status: AC | PRN
  Administered 2023-10-25: 100 mL via INTRAVENOUS

## 2023-10-27 ENCOUNTER — Telehealth: Payer: Self-pay | Admitting: *Deleted

## 2023-10-27 DIAGNOSIS — I471 Supraventricular tachycardia, unspecified: Secondary | ICD-10-CM

## 2023-10-27 NOTE — Telephone Encounter (Signed)
Spoke to pt regarding CT results. Pt wants to know if she still needs to have MRI done? Pt is very concerned about palpitations and SVT's she is having wants to know if she should see a Cardiologist? Told pt Lelon Mast is out of the office and will be back tomorrow and I will get back to her. Pt verbalized understanding.

## 2023-10-27 NOTE — Telephone Encounter (Signed)
Cassandra Warren, spoke to pt. Please see message and advise.

## 2023-10-28 NOTE — Addendum Note (Signed)
 Addended by: Jimmye Norman on: 10/28/2023 09:04 AM   Modules accepted: Orders

## 2023-10-28 NOTE — Telephone Encounter (Signed)
 Spoke to pt told her Cassandra Warren said I recommend proceeding with all imaging based on last discussion we had with her concerns of having possible transient ishemic attack (TIA). Also Cardiology referral placed and someone will contact you to schedule an appt. Pt verbalized understanding and said she called Cardiology and scheduled an appt with Dr. Verlin. Told her okay I will change the referral to request him. Pt verbalized understanding. Referral placed in Epic.

## 2023-10-31 ENCOUNTER — Ambulatory Visit (HOSPITAL_BASED_OUTPATIENT_CLINIC_OR_DEPARTMENT_OTHER)
Admission: RE | Admit: 2023-10-31 | Discharge: 2023-10-31 | Disposition: A | Discharge status: Home / Self Care | Payer: Commercial Managed Care - PPO | Source: Ambulatory Visit | Attending: Physician Assistant | Admitting: Physician Assistant

## 2023-10-31 DIAGNOSIS — R200 Anesthesia of skin: Secondary | ICD-10-CM | POA: Insufficient documentation

## 2023-10-31 DIAGNOSIS — G459 Transient cerebral ischemic attack, unspecified: Secondary | ICD-10-CM | POA: Diagnosis present

## 2023-12-04 ENCOUNTER — Encounter: Payer: Self-pay | Admitting: Physician Assistant

## 2023-12-04 ENCOUNTER — Ambulatory Visit (HOSPITAL_COMMUNITY)
Admission: RE | Admit: 2023-12-04 | Discharge: 2023-12-04 | Disposition: A | Discharge status: Home / Self Care | Payer: Commercial Managed Care - PPO | Source: Ambulatory Visit | Attending: Physician Assistant | Admitting: Physician Assistant

## 2023-12-04 DIAGNOSIS — R200 Anesthesia of skin: Secondary | ICD-10-CM | POA: Diagnosis present

## 2023-12-04 DIAGNOSIS — I10 Essential (primary) hypertension: Secondary | ICD-10-CM

## 2023-12-04 LAB — ECHOCARDIOGRAM COMPLETE
AR max vel: 1.38 cm2
AV Area VTI: 1.19 cm2
AV Area mean vel: 1.26 cm2
AV Mean grad: 4 mm[Hg]
AV Peak grad: 7.8 mm[Hg]
Ao pk vel: 1.4 m/s
Area-P 1/2: 3.27 cm2
MV M vel: 0.78 m/s
MV Peak grad: 2.4 mm[Hg]
S' Lateral: 2.04 cm

## 2023-12-05 ENCOUNTER — Ambulatory Visit: Payer: Commercial Managed Care - PPO | Attending: Internal Medicine | Admitting: Internal Medicine

## 2023-12-05 ENCOUNTER — Encounter: Payer: Self-pay | Admitting: Internal Medicine

## 2023-12-05 VITALS — BP 142/90 | HR 92 | Ht 63.0 in | Wt 151.0 lb

## 2023-12-05 DIAGNOSIS — R002 Palpitations: Secondary | ICD-10-CM | POA: Insufficient documentation

## 2023-12-05 DIAGNOSIS — R200 Anesthesia of skin: Secondary | ICD-10-CM | POA: Diagnosis not present

## 2023-12-05 DIAGNOSIS — D151 Benign neoplasm of heart: Secondary | ICD-10-CM

## 2023-12-05 DIAGNOSIS — Z8673 Personal history of transient ischemic attack (TIA), and cerebral infarction without residual deficits: Secondary | ICD-10-CM | POA: Diagnosis not present

## 2023-12-05 DIAGNOSIS — I471 Supraventricular tachycardia, unspecified: Secondary | ICD-10-CM | POA: Insufficient documentation

## 2023-12-05 DIAGNOSIS — I10 Essential (primary) hypertension: Secondary | ICD-10-CM

## 2023-12-05 NOTE — Telephone Encounter (Signed)
 Dr. Daneil Dunker, please see message and advise. Sam said she talked to you about this patient.

## 2023-12-05 NOTE — Addendum Note (Signed)
 Addended by: Gloriann Larger A on: 12/05/2023 05:14 PM   Modules accepted: Orders

## 2023-12-05 NOTE — H&P (View-Only) (Signed)
 Cardiology Office Note:  .    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   History of Present Illness: SABRA    Cassandra Warren is a 58 y.o. female with palpitations, hypertension, and non-sustained supraventricular tachycardia who presents with concerns about a potential heart valve issue.  She has a history of palpitations and was diagnosed with hypertension and non-sustained supraventricular tachycardia, which are associated with palpitations and chest pain. A heart monitor in 2024 revealed two short runs of non-sustained ventricular tachycardia, with the longest being seven beats. She was last seen in the emergency room on Christmas for these issues.  A coronary artery calcium score performed on October 21, 2023, was zero, indicating no calcification. A CT scan about a month and ten days ago showed slight thickening of the aortic valve but no high-risk features. An echocardiogram was performed recently, and she is concerned about findings she read, including a potential 'leaky valve' and a 'tumor' on her heart valve. MRIs and CTs have returned without significant findings, providing some reassurance.  She experienced severe pain in both arms and numbness in her arm, prompting her visit to the ER on Christmas. She describes feeling odd that night and attributes some of her symptoms to stress and possibly a panic attack. She mentions tingling sensations in her left jaw and arm.  She expresses anxiety about her condition, particularly after receiving test results, and describes a busy and stressful family life, especially around Christmas, which may have contributed to her symptoms.  Relevant histories: .  Social  - father sees [facility] - lives in [place] ROS: As per HPI.   Studies Reviewed: .    Cardiac Studies & Procedures      ECHOCARDIOGRAM  ECHOCARDIOGRAM COMPLETE 12/04/2023  Narrative ECHOCARDIOGRAM REPORT    Patient Name:   Cassandra Warren Date of Exam: 12/04/2023 Medical Rec #:  [id number]      Height:       62.0 in Accession #:    [id number]     Weight:       153.5 lb Date of Birth:  1966-08-06       BSA:          1.708 m Patient Age:    57 years       BP:           117/74 mmHg Patient Gender: F              HR:           81 bpm. Exam Location:  Outpatient  Procedure: 2D Echo, 3D Echo, Cardiac Doppler, Color Doppler and Strain Analysis  Indications:    Left sided numbness [R20.0 (ICD-10-CM)], TIA G45.9  History:        Patient has no prior history of Echocardiogram examinations. Risk Factors:Hypertension.  Sonographer:    Rosaline Fujisawa MHA, RDMS, RVT, RDCS Referring Phys: [id number] [facility]   Sonographer Comments: Global longitudinal strain was attempted. IMPRESSIONS   1. Left ventricular ejection fraction, by estimation, is 60 to 65%. The left ventricle has normal function. The left ventricle has no regional wall motion abnormalities. Left ventricular diastolic parameters were normal. 2. Right ventricular systolic function is normal. The right ventricular size is normal. Tricuspid regurgitation signal is inadequate for assessing  PA pressure. 3. The mitral valve is normal in structure. No evidence of mitral valve regurgitation. 4. Small mobile echodensity on the NCC . The aortic valve is tricuspid. Aortic valve regurgitation is not visualized. 5. The inferior vena cava is normal in size with greater than 50% respiratory variability, suggesting right atrial pressure of 3 mmHg.  Conclusion(s)/Recommendation(s): Small mobile echodensity on the aortic NCC, with hx of ?stroke; ddx includes a vegetation vs calcification vs papillary fibroelastoma recommend TEE.  FINDINGS Left Ventricle: Left ventricular ejection fraction, by estimation, is 60 to 65%. The  left ventricle has normal function. The left ventricle has no regional wall motion abnormalities. The left ventricular internal cavity size was normal in size. There is no left ventricular hypertrophy. Left ventricular diastolic parameters were normal.  Right Ventricle: The right ventricular size is normal. Right ventricular systolic function is normal. Tricuspid regurgitation signal is inadequate for assessing PA pressure.  Left Atrium: Left atrial size was normal in size.  Right Atrium: Right atrial size was normal in size.  Pericardium: There is no evidence of pericardial effusion.  Mitral Valve: The mitral valve is normal in structure. No evidence of mitral valve regurgitation.  Tricuspid Valve: Tricuspid valve regurgitation is not demonstrated.  Aortic Valve: Small mobile echodensity on the NCC. The aortic valve is tricuspid. Aortic valve regurgitation is not visualized. Aortic valve mean gradient measures 4.0 mmHg. Aortic valve peak gradient measures 7.8 mmHg. Aortic valve area, by VTI measures 1.19 cm.  Pulmonic Valve: Pulmonic valve regurgitation is not visualized.  Aorta: The aortic root and ascending aorta are structurally normal, with no evidence of dilitation.  Venous: The inferior vena cava is normal in size with greater than 50% respiratory variability, suggesting right atrial pressure of 3 mmHg.  IAS/Shunts: The interatrial septum was not well visualized.   LEFT VENTRICLE PLAX 2D LVIDd:         3.17 cm   Diastology LVIDs:         2.04 cm   LV e' medial:    9.90 cm/s LV PW:         0.89 cm   LV E/e' medial:  9.0 LV IVS:        0.72 cm   LV e' lateral:   11.20 cm/s LVOT diam:     1.59 cm   LV E/e' lateral: 8.0 LV SV:         34 LV SV Index:   20        2D Longitudinal Strain LVOT Area:     1.99 cm  2D Strain GLS Avg:     -19.0 %  3D Volume EF: 3D EF:        54 % LV EDV:       96 ml LV ESV:       45 ml LV SV:        52 ml  RIGHT VENTRICLE             IVC RV  S prime:     11.90 cm/s  IVC diam: 1.21 cm TAPSE (M-mode): 1.7 cm  LEFT ATRIUM             Index        RIGHT ATRIUM           Index LA diam:        2.72 cm 1.59 cm/m   RA Area:     12.70 cm LA Vol (A2C):   25.4 ml 14.87 ml/m  RA Volume:   32.60 ml  19.08 ml/m LA Vol (A4C):   19.8 ml 11.59 ml/m LA Biplane Vol: 24.3 ml 14.22 ml/m AORTIC VALVE AV Area (Vmax):    1.38 cm AV Area (Vmean):   1.26 cm AV Area (VTI):     1.19 cm AV Vmax:           140.00 cm/s AV Vmean:          95.100 cm/s AV VTI:            0.288 m AV Peak Grad:      7.8 mmHg AV Mean Grad:      4.0 mmHg LVOT Vmax:         97.50 cm/s LVOT Vmean:        60.300 cm/s LVOT VTI:          0.172 m LVOT/AV VTI ratio: 0.60  AORTA Ao Root diam: 2.75 cm Ao Asc diam:  2.87 cm  MITRAL VALVE MV Area (PHT): 3.27 cm    SHUNTS MV Decel Time: 232 msec    Systemic VTI:  0.17 m MR Peak grad: 2.4 mmHg     Systemic Diam: 1.59 cm MR Vmax:      78.00 cm/s MV E velocity: 89.10 cm/s MV A velocity: 84.90 cm/s MV E/A ratio:  1.05  Photographer signed by Ronal Ross Signature Date/Time: 12/04/2023/12:36:09 PM    Final   MONITORS  LONG TERM MONITOR (3-14 DAYS) 09/24/2022  Narrative   Basic rhythm is normal sinus with an average heart rate of 79 bpm.   2 nonsustained SVT up symptoms occurred and 1 was associated with patient event.  Fastest rate 174 bpm lasting 7 beats.   No atrial fibrillation.   PVC burden less than 1%   Supraventricular ectopic burden less than 1%.   Overall, there was no coronary patient between symptomatic patient events and arrhythmia.  OVERALL: The [place] is benign with 2 instances of brief nonsustained SVT.  No atrial fibrillation.    Patch Wear Time:  6 days and 13 hours (2023-11-12T20:16:45-0500 to 2023-11-19T10:14:10-498)  Patient had a min HR of 54 bpm, max HR of 174 bpm, and avg HR of 79 bpm. Predominant underlying rhythm was Sinus Rhythm. 2 Supraventricular Tachycardia runs  occurred, the run with the fastest interval lasting 7 beats with a max rate of 174 bpm (avg 150 bpm); the run with the fastest interval was also the longest. Supraventricular Tachycardia was detected within +/- 45 seconds of symptomatic patient event(s). Isolated SVEs were rare (<1.0%), SVE Couplets were rare (<1.0%), and SVE Triplets were rare (<1.0%). Isolated VEs were rare (<1.0%), and no VE Couplets or VE Triplets were present.  CT SCANS  CT CARDIAC SCORING (SELF PAY ONLY) 10/21/2023  Addendum 10/21/2023  9:27 PM ADDENDUM REPORT: 10/21/2023 21:25  EXAM: OVER-READ INTERPRETATION  CT CHEST  The following report is an over-read performed by radiologist Dr. Fonda Mom [facility] on 10/21/2023. This over-read does not include interpretation of cardiac or coronary anatomy or pathology. The coronary calcium score interpretation by the cardiologist is attached.  COMPARISON:  None.  FINDINGS: Cardiovascular:  See findings discussed in the body of the report.  Mediastinum/Nodes: No suspicious adenopathy identified. Imaged mediastinal structures are unremarkable.  Lungs/Pleura: Imaged lungs are clear. No pleural effusion or pneumothorax.  Upper Abdomen: Moderate-sized hiatal hernia.  Musculoskeletal: No chest wall abnormality. No acute osseous findings.  IMPRESSION: Hiatal hernia.   Electronically Signed By: Fonda Shelvia HERO.D.  On: 10/21/2023 21:25  Narrative CLINICAL DATA:  Cardiovascular Disease Risk stratification  EXAM: Coronary Calcium Score  TECHNIQUE: A gated, non-contrast computed tomography scan of the heart was performed using 3mm slice thickness. Axial images were analyzed on a dedicated workstation. Calcium scoring of the coronary arteries was performed using the Agatston method.  FINDINGS: Coronary arteries: Normal origins.  Coronary Calcium Score:  Left main: 0  Left anterior descending artery: 0  Left circumflex artery:  0  Right coronary artery: 0  Total: 0  Percentile: <1  Pericardium: Normal.  Ascending Aorta: Normal caliber.  Hiatal Hernia  Non-cardiac: See separate report from [facility].  IMPRESSION: 1. Coronary calcium score of 0. This was <1 percentile for age-, race-, and sex-matched controls.  2. Hiatal Hernia  RECOMMENDATIONS: Coronary artery calcium (CAC) score is a strong predictor of incident coronary heart disease (CHD) and provides predictive information beyond traditional risk factors. CAC scoring is reasonable to use in the decision to withhold, postpone, or initiate statin therapy in intermediate-risk or selected borderline-risk asymptomatic adults (age 30-75 years and LDL-C >=70 to <190 mg/dL) who do not have diabetes or established atherosclerotic cardiovascular disease (ASCVD).* In intermediate-risk (10-year ASCVD risk >=7.5% to <20%) adults or selected borderline-risk (10-year ASCVD risk >=5% to <7.5%) adults in whom a CAC score is measured for the purpose of making a treatment decision the following recommendations have been made:  If CAC=0, it is reasonable to withhold statin therapy and reassess in 5 to 10 years, as long as higher risk conditions are absent (diabetes mellitus, family history of premature CHD in first degree relatives (males <55 years; females <65 years), cigarette smoking, or LDL >=190 mg/dL).  If CAC is 1 to 99, it is reasonable to initiate statin therapy for patients >=68 years of age.  If CAC is >=100 or >=75th percentile, it is reasonable to initiate statin therapy at any age.  Cardiology referral should be considered for patients with CAC scores >=400 or >=75th percentile.  *2018 AHA/ACC/AACVPR/AAPA/ABC/ACPM/ADA/AGS/APhA/ASPC/NLA/PCNA Guideline on the Management of Blood Cholesterol: A Report of the American College of Cardiology/American Heart Association Task Force on Clinical Practice Guidelines. J Am Coll  Cardiol. 2019;73(24):3168-3209.  Electronically Signed: By: Oneil Parchment M.D. On: 10/21/2023 14:01         RADIOLOGY Coronary artery calcium score: Calcium score of 0, no evidence of plaque buildup on aortic valve (10/21/2023) CT scan: Slight thickening of the aortic valve, no high-risk features (10/25/2023)  DIAGNOSTIC Heart monitor: Two short runs of non-sustained ventricular tachycardia, longest run 7 beats (2024) Echocardiogram: Unspecified findings (12/04/2023) EKG: Sinus rhythm unchanged (12/05/2023)   Physical Exam:    VS:  BP (!) 142/90 (BP Location: Left Arm)   Pulse 92   Ht 5' 3 (1.6 m)   Wt 151 lb (68.5 kg)   SpO2 97%   BMI 26.75 kg/m    Wt Readings from Last 3 Encounters:  12/05/23 151 lb (68.5 kg)  10/20/23 153 lb 8 oz (69.6 kg)  02/18/23 154 lb 4 oz (70 kg)    Gen: no distress   Neck: No JVD Cardiac: No Rubs or Gallops, no murmur, RRR +2 radial pulses Respiratory: Clear to auscultation bilaterally, normal effort, normal  respiratory rate GI: Soft, nontender, non-distended  MS: No  edema;  moves all extremities Integument: Skin feels warm Neuro:  At time of evaluation, alert and oriented to person/place/time/situation  Psych: Normal affect, patient feels well   ASSESSMENT AND PLAN: .    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 Discussed potential papillary fibroblastoma, typically non-cancerous and not requiring intervention unless large or stroke-associated. Explained low likelihood of needing open heart surgery. Discussed TEE risks (e.g., damage to teeth, mouth, or esophagus, risk ~1 in 10,000) and alternative non-invasive options like cardiac CT with contrast. Recommended TEE for quicker, definitive results. Surgical intervention for aortic valve papillary fibroelastomas (PFEs) is indicated in the following scenarios: Symptomatic Cases: Surgery is recommended for patients presenting with  symptoms such as embolic events, including stroke or transient ischemic attacks (TIAs), due to the high risk of complications from embolization; I do not suspect she truly had a TIA Asymptomatic but High-Risk Tumors: Surgical removal is advised for asymptomatic PFEs that are large (>1 cm), mobile - Schedule transesophageal echocardiogram (TEE)  After careful review of history and examination, the risks and benefits of transesophageal echocardiogram have been explained including risks of esophageal damage, perforation (1:10,000 risk), bleeding, pharyngeal hematoma as well as other potential complications associated with conscious sedation including aspiration, arrhythmia, respiratory failure and death. Alternatives to treatment were discussed, questions were answered. Patient is willing to proceed.    Hypertension Anxiety Elevated blood pressure, likely exacerbated by anxiety and stress. - Monitor blood pressure - no medications at this time    Paroxsymal Supraventricular Tachycardia (SVT) Suspect asymptomatic, heart monitor pending   Follow-up - TEE next week - PRN f/u unless high risk features  Stanly Leavens, MD FASE [facility] Cardiologist [facility]  [address] [place], [place] [address] [phone]  5:13 PM

## 2023-12-05 NOTE — Progress Notes (Signed)
 Cardiology Office Note:  .    Date:  12/05/2023  ID:  Cassandra Warren, DOB [birth year], MRN [id number] PCP: Job Lukes, PA  [facility] Providers Cardiologist:  None     CC: Heart Cancer Consulted for the evaluation of PFE at the behest of [name]   History of Present Illness: Cassandra    Cassandra Warren is a 58 y.o. female with palpitations, hypertension, and non-sustained supraventricular tachycardia who presents with concerns about a potential heart valve issue.  She has a history of palpitations and was diagnosed with hypertension and non-sustained supraventricular tachycardia, which are associated with palpitations and chest pain. A heart monitor in 2024 revealed two short runs of non-sustained ventricular tachycardia, with the longest being seven beats. She was last seen in the emergency room on Christmas for these issues.  A coronary artery calcium score performed on October 21, 2023, was zero, indicating no calcification. A CT scan about a month and ten days ago showed slight thickening of the aortic valve but no high-risk features. An echocardiogram was performed recently, and she is concerned about findings she read, including a potential 'leaky valve' and a 'tumor' on her heart valve. MRIs and CTs have returned without significant findings, providing some reassurance.  She experienced severe pain in both arms and numbness in her arm, prompting her visit to the ER on Christmas. She describes feeling odd that night and attributes some of her symptoms to stress and possibly a panic attack. She mentions tingling sensations in her left jaw and arm.  She expresses anxiety about her condition, particularly after receiving test results, and describes a busy and stressful family life, especially around Christmas, which may have contributed to her symptoms.  Relevant histories: .  Social  - father sees [facility] - lives in [place] ROS: As per HPI.   Studies Reviewed: .    Cardiac Studies & Procedures      ECHOCARDIOGRAM  ECHOCARDIOGRAM COMPLETE 12/04/2023  Narrative ECHOCARDIOGRAM REPORT    Patient Name:   Cassandra Warren Date of Exam: 12/04/2023 Medical Rec #:  [id number]      Height:       62.0 in Accession #:    [id number]     Weight:       153.5 lb Date of Birth:  04-03-1966       BSA:          1.708 m Patient Age:    57 years       BP:           117/74 mmHg Patient Gender: F              HR:           81 bpm. Exam Location:  Outpatient  Procedure: 2D Echo, 3D Echo, Cardiac Doppler, Color Doppler and Strain Analysis  Indications:    Left sided numbness [R20.0 (ICD-10-CM)], TIA G45.9  History:        Patient has no prior history of Echocardiogram examinations. Risk Factors:Hypertension.  Sonographer:    Rosaline Fujisawa MHA, RDMS, RVT, RDCS Referring Phys: [id number] [facility]   Sonographer Comments: Global longitudinal strain was attempted. IMPRESSIONS   1. Left ventricular ejection fraction, by estimation, is 60 to 65%. The left ventricle has normal function. The left ventricle has no regional wall motion abnormalities. Left ventricular diastolic parameters were normal. 2. Right ventricular systolic function is normal. The right ventricular size is normal. Tricuspid regurgitation signal is inadequate for assessing  PA pressure. 3. The mitral valve is normal in structure. No evidence of mitral valve regurgitation. 4. Small mobile echodensity on the NCC . The aortic valve is tricuspid. Aortic valve regurgitation is not visualized. 5. The inferior vena cava is normal in size with greater than 50% respiratory variability, suggesting right atrial pressure of 3 mmHg.  Conclusion(s)/Recommendation(s): Small mobile echodensity on the aortic NCC, with hx of ?stroke; ddx includes a vegetation vs calcification vs papillary fibroelastoma recommend TEE.  FINDINGS Left Ventricle: Left ventricular ejection fraction, by estimation, is 60 to 65%. The  left ventricle has normal function. The left ventricle has no regional wall motion abnormalities. The left ventricular internal cavity size was normal in size. There is no left ventricular hypertrophy. Left ventricular diastolic parameters were normal.  Right Ventricle: The right ventricular size is normal. Right ventricular systolic function is normal. Tricuspid regurgitation signal is inadequate for assessing PA pressure.  Left Atrium: Left atrial size was normal in size.  Right Atrium: Right atrial size was normal in size.  Pericardium: There is no evidence of pericardial effusion.  Mitral Valve: The mitral valve is normal in structure. No evidence of mitral valve regurgitation.  Tricuspid Valve: Tricuspid valve regurgitation is not demonstrated.  Aortic Valve: Small mobile echodensity on the NCC. The aortic valve is tricuspid. Aortic valve regurgitation is not visualized. Aortic valve mean gradient measures 4.0 mmHg. Aortic valve peak gradient measures 7.8 mmHg. Aortic valve area, by VTI measures 1.19 cm.  Pulmonic Valve: Pulmonic valve regurgitation is not visualized.  Aorta: The aortic root and ascending aorta are structurally normal, with no evidence of dilitation.  Venous: The inferior vena cava is normal in size with greater than 50% respiratory variability, suggesting right atrial pressure of 3 mmHg.  IAS/Shunts: The interatrial septum was not well visualized.   LEFT VENTRICLE PLAX 2D LVIDd:         3.17 cm   Diastology LVIDs:         2.04 cm   LV e' medial:    9.90 cm/s LV PW:         0.89 cm   LV E/e' medial:  9.0 LV IVS:        0.72 cm   LV e' lateral:   11.20 cm/s LVOT diam:     1.59 cm   LV E/e' lateral: 8.0 LV SV:         34 LV SV Index:   20        2D Longitudinal Strain LVOT Area:     1.99 cm  2D Strain GLS Avg:     -19.0 %  3D Volume EF: 3D EF:        54 % LV EDV:       96 ml LV ESV:       45 ml LV SV:        52 ml  RIGHT VENTRICLE             IVC RV  S prime:     11.90 cm/s  IVC diam: 1.21 cm TAPSE (M-mode): 1.7 cm  LEFT ATRIUM             Index        RIGHT ATRIUM           Index LA diam:        2.72 cm 1.59 cm/m   RA Area:     12.70 cm LA Vol (A2C):   25.4 ml 14.87 ml/m  RA Volume:   32.60 ml  19.08 ml/m LA Vol (A4C):   19.8 ml 11.59 ml/m LA Biplane Vol: 24.3 ml 14.22 ml/m AORTIC VALVE AV Area (Vmax):    1.38 cm AV Area (Vmean):   1.26 cm AV Area (VTI):     1.19 cm AV Vmax:           140.00 cm/s AV Vmean:          95.100 cm/s AV VTI:            0.288 m AV Peak Grad:      7.8 mmHg AV Mean Grad:      4.0 mmHg LVOT Vmax:         97.50 cm/s LVOT Vmean:        60.300 cm/s LVOT VTI:          0.172 m LVOT/AV VTI ratio: 0.60  AORTA Ao Root diam: 2.75 cm Ao Asc diam:  2.87 cm  MITRAL VALVE MV Area (PHT): 3.27 cm    SHUNTS MV Decel Time: 232 msec    Systemic VTI:  0.17 m MR Peak grad: 2.4 mmHg     Systemic Diam: 1.59 cm MR Vmax:      78.00 cm/s MV E velocity: 89.10 cm/s MV A velocity: 84.90 cm/s MV E/A ratio:  1.05  Photographer signed by Ronal Ross Signature Date/Time: 12/04/2023/12:36:09 PM    Final   MONITORS  LONG TERM MONITOR (3-14 DAYS) 09/24/2022  Narrative   Basic rhythm is normal sinus with an average heart rate of 79 bpm.   2 nonsustained SVT up symptoms occurred and 1 was associated with patient event.  Fastest rate 174 bpm lasting 7 beats.   No atrial fibrillation.   PVC burden less than 1%   Supraventricular ectopic burden less than 1%.   Overall, there was no coronary patient between symptomatic patient events and arrhythmia.  OVERALL: The Mobol[place] is benign with 2 instances of brief nonsustained SVT.  No atrial fibrillation.    Patch Wear Time:  6 days and 13 hours (2023-11-12T20:16:45-0500 to 2023-11-19T10:14:10-498)  Patient had a min HR of 54 bpm, max HR of 174 bpm, and avg HR of 79 bpm. Predominant underlying rhythm was Sinus Rhythm. 2 Supraventricular Tachycardia runs  occurred, the run with the fastest interval lasting 7 beats with a max rate of 174 bpm (avg 150 bpm); the run with the fastest interval was also the longest. Supraventricular Tachycardia was detected within +/- 45 seconds of symptomatic patient event(s). Isolated SVEs were rare (<1.0%), SVE Couplets were rare (<1.0%), and SVE Triplets were rare (<1.0%). Isolated VEs were rare (<1.0%), and no VE Couplets or VE Triplets were present.  CT SCANS  CT CARDIAC SCORING (SELF PAY ONLY) 10/21/2023  Addendum 10/21/2023  9:27 PM ADDENDUM REPORT: 10/21/2023 21:25  EXAM: OVER-READ INTERPRETATION  CT CHEST  The following report is an over-read performed by radiologist Dr. Fonda Mom [facility] on 10/21/2023. This over-read does not include interpretation of cardiac or coronary anatomy or pathology. The coronary calcium score interpretation by the cardiologist is attached.  COMPARISON:  None.  FINDINGS: Cardiovascular:  See findings discussed in the body of the report.  Mediastinum/Nodes: No suspicious adenopathy identified. Imaged mediastinal structures are unremarkable.  Lungs/Pleura: Imaged lungs are clear. No pleural effusion or pneumothorax.  Upper Abdomen: Moderate-sized hiatal hernia.  Musculoskeletal: No chest wall abnormality. No acute osseous findings.  IMPRESSION: Hiatal hernia.   Electronically Signed By: Fonda Shelvia HERO.D.  On: 10/21/2023 21:25  Narrative CLINICAL DATA:  Cardiovascular Disease Risk stratification  EXAM: Coronary Calcium Score  TECHNIQUE: A gated, non-contrast computed tomography scan of the heart was performed using 3mm slice thickness. Axial images were analyzed on a dedicated workstation. Calcium scoring of the coronary arteries was performed using the Agatston method.  FINDINGS: Coronary arteries: Normal origins.  Coronary Calcium Score:  Left main: 0  Left anterior descending artery: 0  Left circumflex artery:  0  Right coronary artery: 0  Total: 0  Percentile: <1  Pericardium: Normal.  Ascending Aorta: Normal caliber.  Hiatal Hernia  Non-cardiac: See separate report from [facility].  IMPRESSION: 1. Coronary calcium score of 0. This was <1 percentile for age-, race-, and sex-matched controls.  2. Hiatal Hernia  RECOMMENDATIONS: Coronary artery calcium (CAC) score is a strong predictor of incident coronary heart disease (CHD) and provides predictive information beyond traditional risk factors. CAC scoring is reasonable to use in the decision to withhold, postpone, or initiate statin therapy in intermediate-risk or selected borderline-risk asymptomatic adults (age 30-75 years and LDL-C >=70 to <190 mg/dL) who do not have diabetes or established atherosclerotic cardiovascular disease (ASCVD).* In intermediate-risk (10-year ASCVD risk >=7.5% to <20%) adults or selected borderline-risk (10-year ASCVD risk >=5% to <7.5%) adults in whom a CAC score is measured for the purpose of making a treatment decision the following recommendations have been made:  If CAC=0, it is reasonable to withhold statin therapy and reassess in 5 to 10 years, as long as higher risk conditions are absent (diabetes mellitus, family history of premature CHD in first degree relatives (males <55 years; females <65 years), cigarette smoking, or LDL >=190 mg/dL).  If CAC is 1 to 99, it is reasonable to initiate statin therapy for patients >=68 years of age.  If CAC is >=100 or >=75th percentile, it is reasonable to initiate statin therapy at any age.  Cardiology referral should be considered for patients with CAC scores >=400 or >=75th percentile.  *2018 AHA/ACC/AACVPR/AAPA/ABC/ACPM/ADA/AGS/APhA/ASPC/NLA/PCNA Guideline on the Management of Blood Cholesterol: A Report of the American College of Cardiology/American Heart Association Task Force on Clinical Practice Guidelines. J Am Coll  Cardiol. 2019;73(24):3168-3209.  Electronically Signed: By: Oneil Parchment M.D. On: 10/21/2023 14:01         RADIOLOGY Coronary artery calcium score: Calcium score of 0, no evidence of plaque buildup on aortic valve (10/21/2023) CT scan: Slight thickening of the aortic valve, no high-risk features (10/25/2023)  DIAGNOSTIC Heart monitor: Two short runs of non-sustained ventricular tachycardia, longest run 7 beats (2024) Echocardiogram: Unspecified findings (12/04/2023) EKG: Sinus rhythm unchanged (12/05/2023)   Physical Exam:    VS:  BP (!) 142/90 (BP Location: Left Arm)   Pulse 92   Ht 5' 3 (1.6 m)   Wt 151 lb (68.5 kg)   SpO2 97%   BMI 26.75 kg/m    Wt Readings from Last 3 Encounters:  12/05/23 151 lb (68.5 kg)  10/20/23 153 lb 8 oz (69.6 kg)  02/18/23 154 lb 4 oz (70 kg)    Gen: no distress   Neck: No JVD Cardiac: No Rubs or Gallops, no murmur, RRR +2 radial pulses Respiratory: Clear to auscultation bilaterally, normal effort, normal  respiratory rate GI: Soft, nontender, non-distended  MS: No  edema;  moves all extremities Integument: Skin feels warm Neuro:  At time of evaluation, alert and oriented to person/place/time/situation  Psych: Normal affect, patient feels well   ASSESSMENT AND PLAN: .    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 Discussed potential papillary fibroblastoma, typically non-cancerous and not requiring intervention unless large or stroke-associated. Explained low likelihood of needing open heart surgery. Discussed TEE risks (e.g., damage to teeth, mouth, or esophagus, risk ~1 in 10,000) and alternative non-invasive options like cardiac CT with contrast. Recommended TEE for quicker, definitive results. Surgical intervention for aortic valve papillary fibroelastomas (PFEs) is indicated in the following scenarios: Symptomatic Cases: Surgery is recommended for patients presenting with  symptoms such as embolic events, including stroke or transient ischemic attacks (TIAs), due to the high risk of complications from embolization; I do not suspect she truly had a TIA Asymptomatic but High-Risk Tumors: Surgical removal is advised for asymptomatic PFEs that are large (>1 cm), mobile - Schedule transesophageal echocardiogram (TEE)  After careful review of history and examination, the risks and benefits of transesophageal echocardiogram have been explained including risks of esophageal damage, perforation (1:10,000 risk), bleeding, pharyngeal hematoma as well as other potential complications associated with conscious sedation including aspiration, arrhythmia, respiratory failure and death. Alternatives to treatment were discussed, questions were answered. Patient is willing to proceed.    Hypertension Anxiety Elevated blood pressure, likely exacerbated by anxiety and stress. - Monitor blood pressure - no medications at this time    Paroxsymal Supraventricular Tachycardia (SVT) Suspect asymptomatic, heart monitor pending   Follow-up - TEE next week - PRN f/u unless high risk features  Stanly Leavens, MD FASE [facility] Cardiologist [facility]  [address], [address] [place], [place] [address] [phone]  5:13 PM

## 2023-12-05 NOTE — Patient Instructions (Addendum)
 Medication Instructions:  Your physician recommends that you continue on your current medications as directed. Please refer to the Current Medication list given to you today.  *If you need a refill on your cardiac medications before your next appointment, please call your pharmacy*   Lab Work: BMP, CBC- day of TEE If you have labs (blood work) drawn today and your tests are completely normal, you will receive your results only by: MyChart Message (if you have MyChart) OR A paper copy in the mail If you have any lab test that is abnormal or we need to change your treatment, we will call you to review the results.   Testing/Procedures: Your physician has requested that you have a TEE. During a TEE, sound waves are used to create images of your heart. It provides your doctor with information about the size and shape of your heart and how well your heart's chambers and valves are working. In this test, a transducer is attached to the end of a flexible tube that's guided down your throat and into your esophagus (the tube leading from you mouth to your stomach) to get a more detailed image of your heart. You are not awake for the procedure. Please see the instruction sheet given to you today. For further information please visit [email or web address]     Follow-Up:As needed At [facility], you and your health needs are our priority.  As part of our continuing mission to provide you with exceptional heart care, we have created designated Provider Care Teams.  These Care Teams include your primary Cardiologist (physician) and Advanced Practice Providers (APPs -  Physician Assistants and Nurse Practitioners) who all work together to provide you with the care you need, when you need it.    Provider:   Stanly Leavens, MD  Other Instructions  You are scheduled for a TEE (Transesophageal Echocardiogram) on Wednesday, February 12 with Dr. Leavens.  Please arrive at the [facility]  (Main Entrance A) at [facility]: [address] [place], [place] [address] at 12:00 PM (This time is 1.5  hour(s) before your procedure to ensure your preparation).   Free valet parking service is available. You will check in at ADMITTING.   *Please Note: You will receive a call the day before your procedure to confirm the appointment time. That time may have changed from the original time based on the schedule for that day.*   DIET:  Nothing to eat or drink after midnight except a sip of water with medications (see medication instructions below)  MEDICATION INSTRUCTIONS: !!IF ANY NEW MEDICATIONS ARE STARTED AFTER TODAY, PLEASE NOTIFY YOUR PROVIDER AS SOON AS POSSIBLE!!  FYI: Medications such as Semaglutide (Ozempic, Wegovy), Tirzepatide (Mounjaro, Zepbound), Dulaglutide (Trulicity), etc (GLP1 agonists) AND Canagliflozin (Invokana), Dapagliflozin (Farxiga), Empagliflozin (Jardiance), Ertugliflozin (Steglatro), Bexagliflozin [employer]) or any combination with one of these drugs such as Invokamet (Canagliflozin/Metformin), Synjardy (Empagliflozin/Metformin), etc (SGLT2 inhibitors) must be held around the time of a procedure. This is not a comprehensive list of all of these drugs. Please review all of your medications and talk to your provider if you take any one of these. If you are not sure, ask your provider.    LABS: Day of TEE  FYI:  For your safety, and to allow us  to monitor your vital signs accurately during the surgery/procedure we request: If you have artificial nails, gel coating, SNS etc, please have those removed prior to your surgery/procedure. Not having the nail coverings /polish removed may result in  cancellation or delay of your surgery/procedure.  Your support person will be asked to wait in the waiting room during your procedure.  It is OK to have someone drop you off and come back when you are ready to be discharged.  You cannot drive after the procedure and will  need someone to drive you home.  Bring your insurance cards.   *Special Note: Every effort is made to have your procedure done on time. Occasionally there are emergencies that occur at the hospital that may cause delays. Please be patient if a delay does occur.

## 2023-12-05 NOTE — Telephone Encounter (Signed)
 She has an appointment with cardiology later today. She can discuss with them.  Cassandra Warren. Daneil Dunker, MD 12/05/2023 12:32 PM

## 2023-12-09 ENCOUNTER — Encounter: Payer: Self-pay | Admitting: Internal Medicine

## 2023-12-09 LAB — BASIC METABOLIC PANEL
BUN/Creatinine Ratio: 15 (ref 9–23)
BUN: 15 mg/dL (ref 6–24)
CO2: 23 mmol/L (ref 20–29)
Calcium: 9.4 mg/dL (ref 8.7–10.2)
Chloride: 106 mmol/L (ref 96–106)
Creatinine, Ser: 0.99 mg/dL (ref 0.57–1.00)
Glucose: 88 mg/dL (ref 70–99)
Potassium: 4.2 mmol/L (ref 3.5–5.2)
Sodium: 143 mmol/L (ref 134–144)
eGFR: 67 mL/min/{1.73_m2} (ref >59)

## 2023-12-09 LAB — CBC
Hematocrit: 44.4 % (ref 34.0–46.6)
Hemoglobin: 14.8 g/dL (ref 11.1–15.9)
MCH: 31.8 pg (ref 26.6–33.0)
MCHC: 33.3 g/dL (ref 31.5–35.7)
MCV: 96 fL (ref 79–97)
Platelets: 216 10*3/uL (ref 150–450)
RBC: 4.65 x10E6/uL (ref 3.77–5.28)
RDW: 12.4 % (ref 11.7–15.4)
WBC: 6.6 10*3/uL (ref 3.4–10.8)

## 2023-12-09 NOTE — Progress Notes (Signed)
Spoke to pt and instructed them to come at 1200 and to be NPO after 0000.    Confirmed that pt will have a ride home and someone to stay with them for 24 hours after the procedure. Instructed patient to not wear any jewelry or lotion.

## 2023-12-10 ENCOUNTER — Encounter (HOSPITAL_COMMUNITY)
Admission: RE | Disposition: A | Discharge status: Home / Self Care | Payer: Self-pay | Source: Home / Self Care | Attending: Internal Medicine

## 2023-12-10 ENCOUNTER — Encounter (HOSPITAL_COMMUNITY): Payer: Self-pay | Admitting: Internal Medicine

## 2023-12-10 ENCOUNTER — Ambulatory Visit (HOSPITAL_COMMUNITY): Payer: Commercial Managed Care - PPO

## 2023-12-10 ENCOUNTER — Ambulatory Visit (HOSPITAL_BASED_OUTPATIENT_CLINIC_OR_DEPARTMENT_OTHER)
Admission: RE | Admit: 2023-12-10 | Discharge: 2023-12-10 | Disposition: A | Discharge status: Home / Self Care | Payer: Commercial Managed Care - PPO | Source: Ambulatory Visit | Attending: Internal Medicine | Admitting: Internal Medicine

## 2023-12-10 ENCOUNTER — Ambulatory Visit (HOSPITAL_COMMUNITY)
Admission: RE | Admit: 2023-12-10 | Discharge: 2023-12-10 | Disposition: A | Discharge status: Home / Self Care | Payer: Commercial Managed Care - PPO | Attending: Internal Medicine | Admitting: Internal Medicine

## 2023-12-10 ENCOUNTER — Other Ambulatory Visit: Payer: Self-pay

## 2023-12-10 DIAGNOSIS — R002 Palpitations: Secondary | ICD-10-CM | POA: Diagnosis not present

## 2023-12-10 DIAGNOSIS — R9439 Abnormal result of other cardiovascular function study: Secondary | ICD-10-CM | POA: Diagnosis not present

## 2023-12-10 DIAGNOSIS — E039 Hypothyroidism, unspecified: Secondary | ICD-10-CM

## 2023-12-10 DIAGNOSIS — I471 Supraventricular tachycardia, unspecified: Secondary | ICD-10-CM | POA: Diagnosis not present

## 2023-12-10 DIAGNOSIS — D151 Benign neoplasm of heart: Secondary | ICD-10-CM | POA: Diagnosis present

## 2023-12-10 DIAGNOSIS — I361 Nonrheumatic tricuspid (valve) insufficiency: Secondary | ICD-10-CM

## 2023-12-10 DIAGNOSIS — F419 Anxiety disorder, unspecified: Secondary | ICD-10-CM | POA: Diagnosis not present

## 2023-12-10 DIAGNOSIS — I10 Essential (primary) hypertension: Secondary | ICD-10-CM

## 2023-12-10 HISTORY — PX: TRANSESOPHAGEAL ECHOCARDIOGRAM (CATH LAB): EP1270

## 2023-12-10 LAB — ECHO TEE

## 2023-12-10 SURGERY — TRANSESOPHAGEAL ECHOCARDIOGRAM (TEE) (CATHLAB)
Anesthesia: Monitor Anesthesia Care

## 2023-12-10 MED ORDER — ONDANSETRON HCL 4 MG/2ML IJ SOLN
INTRAMUSCULAR | Status: DC | PRN
  Administered 2023-12-10: 4 mg via INTRAVENOUS

## 2023-12-10 MED ORDER — LIDOCAINE 2% (20 MG/ML) 5 ML SYRINGE
INTRAMUSCULAR | Status: DC | PRN
  Administered 2023-12-10: 40 mg via INTRAVENOUS
  Administered 2023-12-10: 60 mg via INTRAVENOUS

## 2023-12-10 MED ORDER — PROPOFOL 500 MG/50ML IV EMUL
INTRAVENOUS | Status: DC | PRN
  Administered 2023-12-10: 200 ug/kg/min via INTRAVENOUS

## 2023-12-10 MED ORDER — PROPOFOL 10 MG/ML IV BOLUS
INTRAVENOUS | Status: DC | PRN
  Administered 2023-12-10: 100 mg via INTRAVENOUS

## 2023-12-10 MED ORDER — SODIUM CHLORIDE 0.9 % IV SOLN: INTRAVENOUS | Status: DC

## 2023-12-10 NOTE — Discharge Instructions (Signed)

## 2023-12-10 NOTE — Transfer of Care (Signed)
Immediate Anesthesia Transfer of Care Note  Patient: Cassandra Warren  Procedure(s) Performed: TRANSESOPHAGEAL ECHOCARDIOGRAM  Patient Location: PACU  Anesthesia Type:MAC  Level of Consciousness: awake, alert , and oriented  Airway & Oxygen Therapy: Patient Spontanous Breathing and Patient connected to nasal cannula oxygen  Post-op Assessment: Report given to RN and Post -op Vital signs reviewed and stable  Post vital signs: Reviewed and stable  Last Vitals:  Vitals Value Taken Time  BP 107/82 12/10/23 1320  Temp 36.7 C 12/10/23 1319  Pulse 76 12/10/23 1322  Resp 12 12/10/23 1322  SpO2 95 % 12/10/23 1322  Vitals shown include unfiled device data.  Last Pain:  Vitals:   12/10/23 1319  TempSrc: Temporal  PainSc: 0-No pain         Complications: No notable events documented.

## 2023-12-10 NOTE — CV Procedure (Signed)
    TRANSESOPHAGEAL ECHOCARDIOGRAM   NAME:  Cassandra Warren    MRN: [id number] DOB:  Nov 26, 1965    ADMIT DATE: 12/10/2023  INDICATIONS: Rule out Papillary fibroelastoma  PROCEDURE:   Informed consent was obtained prior to the procedure. The risks, benefits and alternatives for the procedure were discussed and the patient comprehended these risks.  Risks include, but are not limited to, cough, sore throat, vomiting, nausea, somnolence, esophageal and stomach trauma or perforation, bleeding, low blood pressure, aspiration, pneumonia, infection, trauma to the teeth and death.    Procedural time out performed.   Anesthesia was administered by Dr. Charlynn Grimes and team.  TThe patient's heart rate, blood pressure, and oxygen saturation are monitored continuously during the procedure.   The transesophageal probe was inserted in the esophagus and stomach without difficulty and multiple views were obtained.   COMPLICATIONS:    There were no immediate complications.  KEY FINDINGS:  Incidental Lambl's Excrescenes noted.  Full report to follow. Further management per primary team.   Riley Lam, MD [place]  [facility]  1:29 PM

## 2023-12-10 NOTE — Anesthesia Preprocedure Evaluation (Addendum)
Anesthesia Evaluation  Patient identified by MRN, date of birth, ID band Patient awake    Reviewed: Allergy & Precautions, H&P , NPO status , Patient's Chart, lab work & pertinent test results  Airway Mallampati: II  TM Distance: >3 FB Neck ROM: Full    Dental no notable dental hx.    Pulmonary neg pulmonary ROS   Pulmonary exam normal breath sounds clear to auscultation       Cardiovascular hypertension, Normal cardiovascular exam Rhythm:Regular Rate:Normal  Mobile echodensity visualized on NCC. 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 Left ventricular ejection fraction, by estimation, is 60 to 65%. The  left ventricle has normal function. The left ventricle has no regional  wall motion abnormalities. Left ventricular diastolic parameters were  normal.   2. Right ventricular systolic function is normal. The right ventricular  size is normal. Tricuspid regurgitation signal is inadequate for assessing  PA pressure.   3. The mitral valve is normal in structure. No evidence of mitral valve  regurgitation.   4. Small mobile echodensity on the NCC . The aortic valve is tricuspid.  Aortic valve regurgitation is not visualized.   5. The inferior vena cava is normal in size with greater than 50%  respiratory variability, suggesting right atrial pressure of 3 mmHg.     Neuro/Psych Hx of TIA  negative psych ROS   GI/Hepatic Neg liver ROS,GERD  ,,  Endo/Other  Hypothyroidism    Renal/GU   negative genitourinary   Musculoskeletal  (+) Arthritis ,    Abdominal   Peds negative pediatric ROS (+)  Hematology  (+) Blood dyscrasia, anemia   Anesthesia Other Findings   Reproductive/Obstetrics negative OB ROS                             Anesthesia Physical Anesthesia Plan  ASA: 3  Anesthesia Plan: MAC   Post-op Pain Management:    Induction: Intravenous  PONV Risk Score and  Plan: 2 and Propofol infusion and Treatment may vary due to age or medical condition  Airway Management Planned: Natural Airway  Additional Equipment:   Intra-op Plan:   Post-operative Plan:   Informed Consent: I have reviewed the patients History and Physical, chart, labs and discussed the procedure including the risks, benefits and alternatives for the proposed anesthesia with the patient or authorized representative who has indicated his/her understanding and acceptance.     Dental advisory given  Plan Discussed with: CRNA  Anesthesia Plan Comments:         Anesthesia Quick Evaluation

## 2023-12-10 NOTE — Interval H&P Note (Signed)
History and Physical Interval Note:  12/10/2023 12:45 PM  Cassandra Warren  has presented today for surgery, with the diagnosis of PFE.  The various methods of treatment have been discussed with the patient and family. After consideration of risks, benefits and other options for treatment, the patient has consented to  Procedure(s): TRANSESOPHAGEAL ECHOCARDIOGRAM (N/A) as a surgical intervention.  The patient's history has been reviewed, patient examined, no change in status, stable for surgery.  I have reviewed the patient's chart and labs.  Questions were answered to the patient's satisfaction.     Akeema Broder A Waris Rodger

## 2023-12-10 NOTE — Anesthesia Postprocedure Evaluation (Signed)
Anesthesia Post Note  Patient: Cassandra Warren  Procedure(s) Performed: TRANSESOPHAGEAL ECHOCARDIOGRAM     Patient location during evaluation: PACU Anesthesia Type: MAC Level of consciousness: awake and alert Pain management: pain level controlled Vital Signs Assessment: post-procedure vital signs reviewed and stable Respiratory status: spontaneous breathing, nonlabored ventilation, respiratory function stable and patient connected to nasal cannula oxygen Cardiovascular status: stable and blood pressure returned to baseline Postop Assessment: no apparent nausea or vomiting Anesthetic complications: no   No notable events documented.  Last Vitals:  Vitals:   12/10/23 1319 12/10/23 1320  BP:  107/82  Pulse:  75  Resp:  14  Temp: 36.7 C   SpO2:  97%    Last Pain:  Vitals:   12/10/23 1319  TempSrc: Temporal  PainSc: 0-No pain                  [place]

## 2023-12-30 ENCOUNTER — Other Ambulatory Visit: Payer: Self-pay | Admitting: Physician Assistant

## 2024-01-02 ENCOUNTER — Ambulatory Visit: Payer: Commercial Managed Care - PPO | Admitting: Cardiovascular Disease

## 2024-02-06 ENCOUNTER — Other Ambulatory Visit: Payer: Self-pay | Admitting: Physician Assistant

## 2024-02-06 MED ORDER — LISINOPRIL 10 MG PO TABS
10.0000 mg | ORAL_TABLET | Freq: Every day | ORAL | 0 refills | Status: DC
Start: 2024-02-06 — End: 2024-06-18

## 2024-03-04 ENCOUNTER — Other Ambulatory Visit: Payer: Self-pay | Admitting: Physician Assistant

## 2024-03-04 DIAGNOSIS — Z1231 Encounter for screening mammogram for malignant neoplasm of breast: Secondary | ICD-10-CM

## 2024-03-17 LAB — VITAMIN B12: Vitamin B-12: 715

## 2024-03-31 ENCOUNTER — Telehealth: Payer: Self-pay | Admitting: *Deleted

## 2024-03-31 MED ORDER — FAMOTIDINE 40 MG PO TABS: 40.0000 mg | ORAL_TABLET | Freq: Every day | ORAL | 1 refills | Status: DC | PRN

## 2024-03-31 NOTE — Telephone Encounter (Signed)
 See message, okay to change order for Pepcid  to 40 mg daily?

## 2024-03-31 NOTE — Telephone Encounter (Signed)
 Copied from CRM [id number]. Topic: General - Other >> Mar 31, 2024 11:10 AM Howard Macho wrote: Reason for CRM: patient called stating she need a script change because her insurance will not cover the famotidine  (PEPCID ) 20 MG tablet but they will cover the 40mg . [facility] is sending a fax with a  change. Patient would like a call back. CB [phone]

## 2024-03-31 NOTE — Telephone Encounter (Signed)
 Spoke to pt told her Rx for Pepcid  40 mg sent to [facility]. Pt verbalized understanding.

## 2024-03-31 NOTE — Addendum Note (Signed)
 Addended by: Winona Haw on: 03/31/2024 02:06 PM   Modules accepted: Orders

## 2024-04-02 ENCOUNTER — Encounter: Payer: Self-pay | Admitting: Physician Assistant

## 2024-04-06 ENCOUNTER — Ambulatory Visit
Admission: RE | Admit: 2024-04-06 | Discharge: 2024-04-06 | Disposition: A | Discharge status: Home / Self Care | Source: Ambulatory Visit | Attending: Physician Assistant | Admitting: Physician Assistant

## 2024-04-06 DIAGNOSIS — Z1231 Encounter for screening mammogram for malignant neoplasm of breast: Secondary | ICD-10-CM

## 2024-06-05 ENCOUNTER — Encounter: Payer: Self-pay | Admitting: Physician Assistant

## 2024-06-07 ENCOUNTER — Other Ambulatory Visit: Payer: Self-pay

## 2024-06-07 MED ORDER — SYNTHROID 75 MCG PO TABS: 75.0000 ug | ORAL_TABLET | Freq: Every day | ORAL | 1 refills | Status: AC

## 2024-06-17 ENCOUNTER — Other Ambulatory Visit: Payer: Self-pay | Admitting: Physician Assistant

## 2024-10-06 ENCOUNTER — Other Ambulatory Visit: Payer: Self-pay | Admitting: Physician Assistant

## 2024-10-06 LAB — BASIC METABOLIC PANEL WITH GFR
BUN: 17 (ref 4–21)
CO2: 21 (ref 13–22)
Chloride: 104 (ref 99–108)
Glucose: 79
Potassium: 4.5 meq/L (ref 3.5–5.1)
Sodium: 140 (ref 137–147)

## 2024-10-06 LAB — LIPID PANEL
Cholesterol: 207 — AB (ref 0–200)
HDL: 45 (ref 35–70)
LDL Cholesterol: 141
LDl/HDL Ratio: 4.6
Triglycerides: 114 (ref 40–160)

## 2024-10-06 LAB — HEPATIC FUNCTION PANEL
ALT: 19 U/L (ref 7–35)
AST: 19 (ref 13–35)
Alkaline Phosphatase: 106 (ref 25–125)
Bilirubin, Total: 0.6

## 2024-10-06 LAB — COMPREHENSIVE METABOLIC PANEL WITH GFR
Albumin: 4.2 (ref 3.5–5.0)
Calcium: 9.4 (ref 8.7–10.7)
Globulin: 2.8

## 2024-10-06 LAB — TSH: TSH: 1.02 (ref 0.41–5.90)

## 2024-10-06 LAB — VITAMIN D 25 HYDROXY (VIT D DEFICIENCY, FRACTURES): Vit D, 25-Hydroxy: 52.6

## 2024-10-06 LAB — HEMOGLOBIN A1C: Hemoglobin A1C: 5.7

## 2024-10-13 LAB — CBC AND DIFFERENTIAL
HCT: 46 (ref 36–46)
Hemoglobin: 15.3 (ref 12.0–16.0)
Platelets: 249 10*3/uL (ref 150–400)
WBC: 7

## 2024-10-13 LAB — CBC: RBC: 4.79 (ref 3.87–5.11)

## 2024-10-13 LAB — BASIC METABOLIC PANEL WITH GFR: Creatinine: 1 (ref 0.5–1.1)

## 2024-10-13 LAB — COMPREHENSIVE METABOLIC PANEL WITH GFR: eGFR: 70

## 2024-10-18 ENCOUNTER — Encounter: Payer: Self-pay | Admitting: Physician Assistant

## 2024-10-18 NOTE — Telephone Encounter (Signed)
 Please see pt msg as FYI; pt scheduled for appt.

## 2024-10-19 NOTE — Telephone Encounter (Signed)
 LVM to schedule sooner appt than 11/05/24.

## 2024-10-20 ENCOUNTER — Ambulatory Visit: Admitting: Physician Assistant

## 2024-10-20 ENCOUNTER — Encounter: Payer: Self-pay | Admitting: Physician Assistant

## 2024-10-20 VITALS — BP 120/80 | HR 77 | Temp 97.3°F | Ht 63.0 in | Wt 148.0 lb

## 2024-10-20 DIAGNOSIS — I10 Essential (primary) hypertension: Secondary | ICD-10-CM

## 2024-10-20 DIAGNOSIS — E785 Hyperlipidemia, unspecified: Secondary | ICD-10-CM

## 2024-10-20 DIAGNOSIS — E039 Hypothyroidism, unspecified: Secondary | ICD-10-CM

## 2024-10-20 DIAGNOSIS — R1011 Right upper quadrant pain: Secondary | ICD-10-CM | POA: Diagnosis not present

## 2024-10-20 LAB — CBC WITH DIFFERENTIAL/PLATELET
Basophils Absolute: 0.1 K/uL (ref 0.0–0.1)
Basophils Relative: 0.8 % (ref 0.0–3.0)
Eosinophils Absolute: 0.2 K/uL (ref 0.0–0.7)
Eosinophils Relative: 2.4 % (ref 0.0–5.0)
HCT: 42.8 % (ref 36.0–46.0)
Hemoglobin: 14.8 g/dL (ref 12.0–15.0)
Lymphocytes Relative: 25.3 % (ref 12.0–46.0)
Lymphs Abs: 1.7 K/uL (ref 0.7–4.0)
MCHC: 34.6 g/dL (ref 30.0–36.0)
MCV: 92.5 fl (ref 78.0–100.0)
Monocytes Absolute: 0.5 K/uL (ref 0.1–1.0)
Monocytes Relative: 7.2 % (ref 3.0–12.0)
Neutro Abs: 4.3 K/uL (ref 1.4–7.7)
Neutrophils Relative %: 64.3 % (ref 43.0–77.0)
Platelets: 196 K/uL (ref 150.0–400.0)
RBC: 4.63 Mil/uL (ref 3.87–5.11)
RDW: 12.1 % (ref 11.5–15.5)
WBC: 6.7 K/uL (ref 4.0–10.5)

## 2024-10-20 LAB — HEPATIC FUNCTION PANEL
ALT: 17 U/L (ref 3–35)
AST: 17 U/L (ref 5–37)
Albumin: 4.2 g/dL (ref 3.5–5.2)
Alkaline Phosphatase: 86 U/L (ref 39–117)
Bilirubin, Direct: 0.1 mg/dL (ref 0.1–0.3)
Total Bilirubin: 1 mg/dL (ref 0.2–1.2)
Total Protein: 7.3 g/dL (ref 6.0–8.3)

## 2024-10-20 LAB — TSH: TSH: 1.14 u[IU]/mL (ref 0.35–5.50)

## 2024-10-20 NOTE — Progress Notes (Signed)
 "  History of Present Illness:   Chief Complaint  Patient presents with   Medical Management of Chronic Issues   Heartburn    Cramps around RUQ    Discussed the use of AI scribe software for clinical note transcription with the patient, who gave verbal consent to proceed.  History of Present Illness   Cassandra Warren is a 58 year old female who presents with right upper quadrant abdominal pain and medication management.  She describes RUQ abdominal pain as a dull, small pool sensation that is often triggered by greasy or high-fat foods. Episodes last for hours, are intermittent, and improve when she lies down. She recalls being told she has a small hernia in February and is unsure if it is related. She has not had recent abdominal imaging.  She uses Pepcid  more frequently during periods of increased stress but not every day. Symptoms improve when her diet is healthier. She sometimes splits Pepcid  tablets because of insurance limits on the lower dose.  In 2020, colonoscopy showed nonspecific ascending colon inflammation and a polyp that was removed. A perianal cystic lesion was excised in March 2021 with hemorrhoid ligation, and pathology was benign.  She avoids alcohol because it causes heartburn and makes her feel unwell. She had a stomach virus around Thanksgiving, which may explain a recent elevated white blood cell count. Her LDL is 141, previously 151, and she is working on diet changes for cholesterol management.  Her current medications are Klonopin  as needed, Pepcid  daily, lisinopril  10 mg, and Synthroid  75 mcg. She has used Klonopin  infrequently and has had no recent cardiac or significant anxiety symptoms.        Past Medical History:  Diagnosis Date   Allergy Apr 26, 1966   Anemia    Arthritis    back   Diverticulosis 08/2019   Noted on Colonoscopy   GERD (gastroesophageal reflux disease)    History of colon polyps 08/2019   History of COVID-19    tested positive 3/1 no  symptoms restested a couple of days later negative results x2   Hypertension    Im unsure but its been several years since i started taking lisinopril    Kidney lesion    Mass of perirectal soft tissue    Thyroid  disease      Social History[1]  Past Surgical History:  Procedure Laterality Date   BREAST BIOPSY Left 02/2021   BREAST CYST ASPIRATION Left    CESAREAN SECTION     COLONOSCOPY  09/10/2019   MASS EXCISION N/A 01/21/2020   Procedure: EXCISION PERIRECTAL CYST, EXCISION OF ANAL CANAL POLYP, HEMORRHOID LIGATION/PEXY;  Surgeon: Sheldon Standing, MD;  Location: [facility];  Service: General;  Laterality: N/A;   OOPHORECTOMY     left ovary only   RECTAL EXAM UNDER ANESTHESIA N/A 01/21/2020   Procedure: ANAL EXAM UNDER ANESTHESIA;  Surgeon: Sheldon Standing, MD;  Location: [facility];  Service: General;  Laterality: N/A;   TRANSESOPHAGEAL ECHOCARDIOGRAM (CATH LAB) N/A 12/10/2023   Procedure: TRANSESOPHAGEAL ECHOCARDIOGRAM;  Surgeon: Santo Stanly LABOR, MD;  Location: [facility];  Service: Cardiovascular;  Laterality: N/A;   WISDOM TOOTH EXTRACTION      Family History  Problem Relation Age of Onset   Diabetes Mother    Heart disease Father    Heart attack Father    Breast cancer Paternal Aunt    Lung cancer Maternal Grandmother    Mental illness Brother    Anxiety disorder Brother  Sleep apnea Neg Hx    Colon cancer Neg Hx     Allergies[2]  Current Medications:  Current Medications[3]   Review of Systems:   Negative unless otherwise specified per HPI.  Vitals:   Vitals:   10/20/24 0820  BP: 120/80  Pulse: 77  Temp: (!) 97.3 F (36.3 C)  TempSrc: Temporal  SpO2: 99%  Weight: 148 lb (67.1 kg)  Height: 5' 3 (1.6 m)     Body mass index is 26.22 kg/m.  Physical Exam:   Physical Exam Vitals and nursing note reviewed.  Constitutional:      General: She is not in acute distress.    Appearance: She is  well-developed. She is not ill-appearing or toxic-appearing.  Cardiovascular:     Rate and Rhythm: Normal rate and regular rhythm.     Pulses: Normal pulses.     Heart sounds: Normal heart sounds, S1 normal and S2 normal.  Pulmonary:     Effort: Pulmonary effort is normal.     Breath sounds: Normal breath sounds.  Abdominal:     General: Abdomen is flat. Bowel sounds are normal.     Palpations: Abdomen is soft.     Tenderness: There is no abdominal tenderness.  Skin:    General: Skin is warm and dry.  Neurological:     Mental Status: She is alert.     GCS: GCS eye subscore is 4. GCS verbal subscore is 5. GCS motor subscore is 6.  Psychiatric:        Speech: Speech normal.        Behavior: Behavior normal. Behavior is cooperative.     Assessment and Plan:   Assessment and Plan    Right upper quadrant abdominal pain Intermittent dull pain possibly related to gallbladder disease, among other diagnoses. Previous imaging lacked abdominal ultrasound. Normal liver labs in May, re-evaluation needed due to new symptoms. - Ordered abdominal ultrasound for gallstones or sludge. - Rechecked liver function tests. - Advised low-fat diet. - Consider HIDA scan or GI referral if ultrasound inconclusive.  Hyperlipidemia LDL cholesterol improved to 141 mg/dL from 848 mg/dL. No cholesterol medication needed. - Continue dietary modifications.  Essential hypertension Blood pressure managed with lisinopril . - Continue lisinopril  10 mg daily.  Hypothyroidism Thyroid  function managed with Synthroid . - Continue Synthroid  75 mcg daily.       Lucie Buttner, PA-C    [1]  Social History Tobacco Use   Smoking status: Never   Smokeless tobacco: Never   Tobacco comments:    N/A  Vaping Use   Vaping status: Never Used  Substance Use Topics   Alcohol use: Not Currently    Comment: occ   Drug use: Never  [2]  Allergies Allergen Reactions   Erythromycin Other (See Comments)    Hands  and feet tingling   Amoxicillin  Other (See Comments)   Latex Itching   Sulfamethoxazole-Trimethoprim Other (See Comments)   Morphine And Codeine Rash   Penicillin G Palpitations  [3]  Current Outpatient Medications:    clonazePAM  (KLONOPIN ) 0.5 MG tablet, Take 1 tablet (0.5 mg total) by mouth 2 (two) times daily as needed for anxiety., Disp: 20 tablet, Rfl: 0   famotidine  (PEPCID ) 40 MG tablet, TAKE 1 TABLET DAILY AS NEEDED FOR HEARTBURN OR INDIGESTION, Disp: 90 tablet, Rfl: 3   lisinopril  (ZESTRIL ) 10 MG tablet, TAKE 1 TABLET DAILY, Disp: 90 tablet, Rfl: 3   Multiple Vitamins-Minerals (MULTIVITAMIN WITH MINERALS) tablet, Take 1 tablet by mouth daily., Disp: ,  Rfl:    SYNTHROID  75 MCG tablet, Take 1 tablet (75 mcg total) by mouth daily., Disp: 90 tablet, Rfl: 1  "

## 2024-10-22 ENCOUNTER — Ambulatory Visit: Payer: Self-pay | Admitting: Physician Assistant

## 2024-11-03 ENCOUNTER — Ambulatory Visit
Admission: RE | Admit: 2024-11-03 | Discharge: 2024-11-03 | Disposition: A | Discharge status: Home / Self Care | Source: Ambulatory Visit | Attending: Physician Assistant | Admitting: Physician Assistant

## 2024-11-03 DIAGNOSIS — R1011 Right upper quadrant pain: Secondary | ICD-10-CM

## 2024-11-04 ENCOUNTER — Other Ambulatory Visit

## 2024-11-05 ENCOUNTER — Ambulatory Visit: Admitting: Physician Assistant

## 2024-11-08 ENCOUNTER — Other Ambulatory Visit

## 2024-11-23 ENCOUNTER — Encounter: Payer: Self-pay | Admitting: Physician Assistant
